# Patient Record
Sex: Male | Born: 1955 | Race: White | Hispanic: No | Marital: Married | Smoking: Former smoker
Health system: Southern US, Community
[De-identification: ages and names within clinical notes are randomized; demographics above are authoritative.]

## PROBLEM LIST (undated history)

## (undated) DIAGNOSIS — M519 Unspecified thoracic, thoracolumbar and lumbosacral intervertebral disc disorder: Secondary | ICD-10-CM

## (undated) DIAGNOSIS — E785 Hyperlipidemia, unspecified: Secondary | ICD-10-CM

## (undated) DIAGNOSIS — I2510 Atherosclerotic heart disease of native coronary artery without angina pectoris: Secondary | ICD-10-CM

## (undated) DIAGNOSIS — G4733 Obstructive sleep apnea (adult) (pediatric): Secondary | ICD-10-CM

## (undated) DIAGNOSIS — G8929 Other chronic pain: Secondary | ICD-10-CM

## (undated) DIAGNOSIS — M545 Low back pain, unspecified: Secondary | ICD-10-CM

## (undated) DIAGNOSIS — Z9861 Coronary angioplasty status: Secondary | ICD-10-CM

## (undated) DIAGNOSIS — I10 Essential (primary) hypertension: Secondary | ICD-10-CM

## (undated) DIAGNOSIS — F419 Anxiety disorder, unspecified: Secondary | ICD-10-CM

## (undated) DIAGNOSIS — I358 Other nonrheumatic aortic valve disorders: Secondary | ICD-10-CM

## (undated) DIAGNOSIS — I2111 ST elevation (STEMI) myocardial infarction involving right coronary artery: Secondary | ICD-10-CM

## (undated) HISTORY — DX: Other chronic pain: G89.29

## (undated) HISTORY — DX: Anxiety disorder, unspecified: F41.9

## (undated) HISTORY — DX: Hyperlipidemia, unspecified: E78.5

## (undated) HISTORY — DX: Obstructive sleep apnea (adult) (pediatric): G47.33

## (undated) HISTORY — DX: ST elevation (STEMI) myocardial infarction involving right coronary artery: I21.11

## (undated) HISTORY — DX: Atherosclerotic heart disease of native coronary artery without angina pectoris: I25.10

## (undated) HISTORY — DX: Coronary angioplasty status: Z98.61

## (undated) HISTORY — DX: Low back pain, unspecified: M54.50

## (undated) HISTORY — DX: Low back pain: M54.5

## (undated) HISTORY — DX: Other nonrheumatic aortic valve disorders: I35.8

## (undated) HISTORY — DX: Unspecified thoracic, thoracolumbar and lumbosacral intervertebral disc disorder: M51.9

---

## 1996-05-30 DIAGNOSIS — I2111 ST elevation (STEMI) myocardial infarction involving right coronary artery: Secondary | ICD-10-CM

## 1996-05-30 DIAGNOSIS — I2510 Atherosclerotic heart disease of native coronary artery without angina pectoris: Secondary | ICD-10-CM

## 1996-05-30 HISTORY — DX: ST elevation (STEMI) myocardial infarction involving right coronary artery: I21.11

## 1996-05-30 HISTORY — DX: Atherosclerotic heart disease of native coronary artery without angina pectoris: I25.10

## 1996-12-23 HISTORY — PX: LEFT HEART CATH AND CORONARY ANGIOGRAPHY: CATH118249

## 2003-07-19 DIAGNOSIS — F419 Anxiety disorder, unspecified: Secondary | ICD-10-CM

## 2003-07-19 DIAGNOSIS — G4733 Obstructive sleep apnea (adult) (pediatric): Secondary | ICD-10-CM

## 2003-07-19 HISTORY — DX: Anxiety disorder, unspecified: F41.9

## 2003-07-19 HISTORY — DX: Obstructive sleep apnea (adult) (pediatric): G47.33

## 2003-12-02 HISTORY — PX: HERNIA REPAIR: SHX51

## 2004-02-13 ENCOUNTER — Inpatient Hospital Stay (HOSPITAL_COMMUNITY): Admission: RE | Admit: 2004-02-13 | Discharge: 2004-02-15 | Payer: Self-pay | Admitting: Surgery

## 2005-01-11 IMAGING — RF DG UGI W/ GASTROGRAFIN
11 series · 11 of 11 positions shown · non-contrast
Comparison: none

CLINICAL DATA: Hiatal hernia.  Status post surgical repair and gastropexy.
SINGLE COLUMN UPPER GI

[Series 1: run · 1 of 1 slices shown (1 of 11)]
[im 1/1]
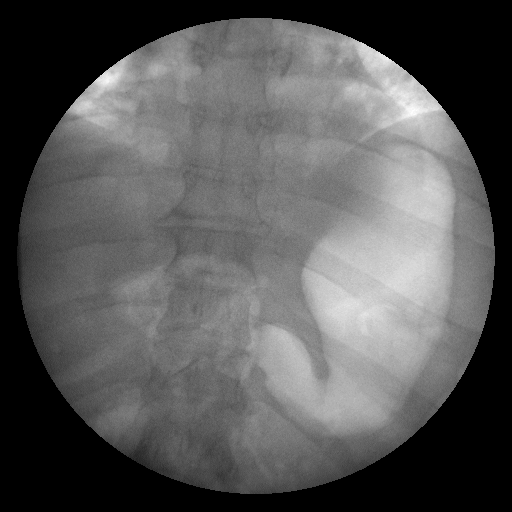

[Series 2: run · 1 of 1 slices shown (2 of 11)]
[im 1/1]
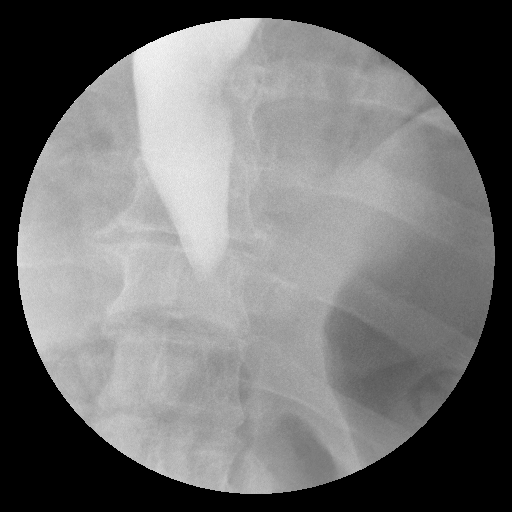

[Series 3: run · 1 of 1 slices shown (3 of 11)]
[im 1/1]
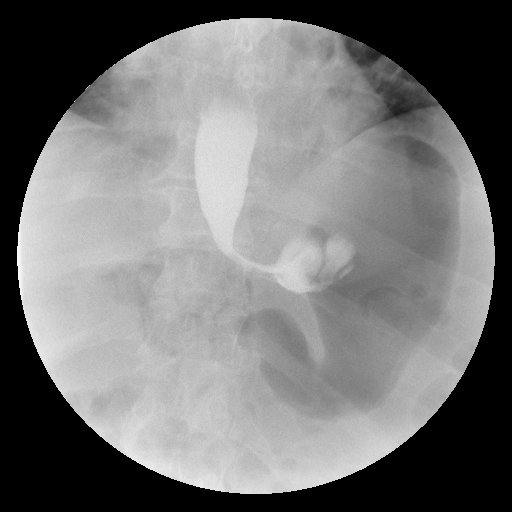

[Series 4: run · 1 of 1 slices shown (4 of 11)]
[im 1/1]
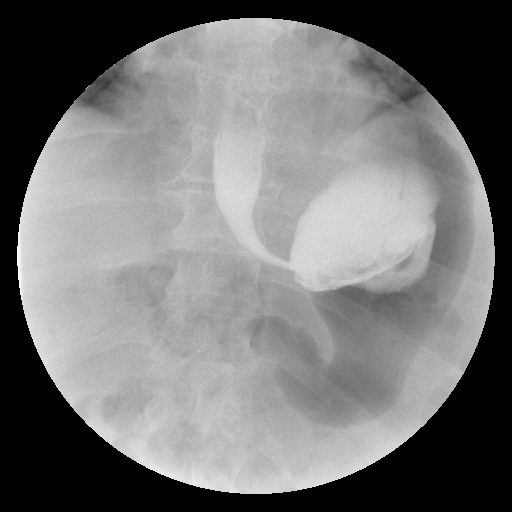

[Series 5: run · 1 of 1 slices shown (5 of 11)]
[im 1/1]
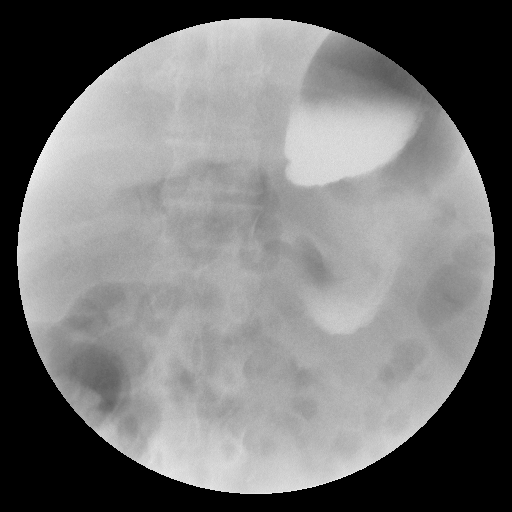

[Series 6: run · 1 of 1 slices shown (6 of 11)]
[im 1/1]
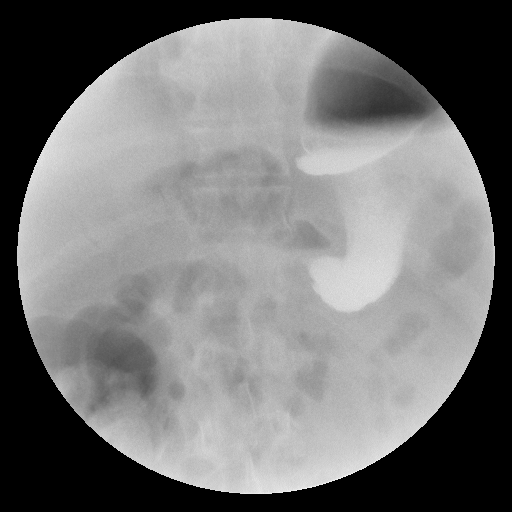

[Series 7: run · 1 of 1 slices shown (7 of 11)]
[im 1/1]
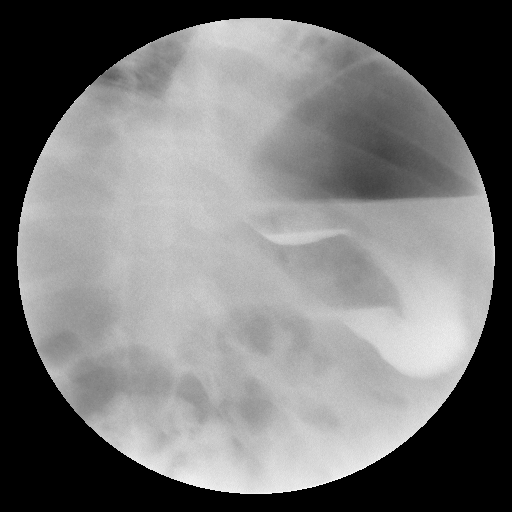

[Series 8: run · 1 of 1 slices shown (8 of 11)]
[im 1/1]
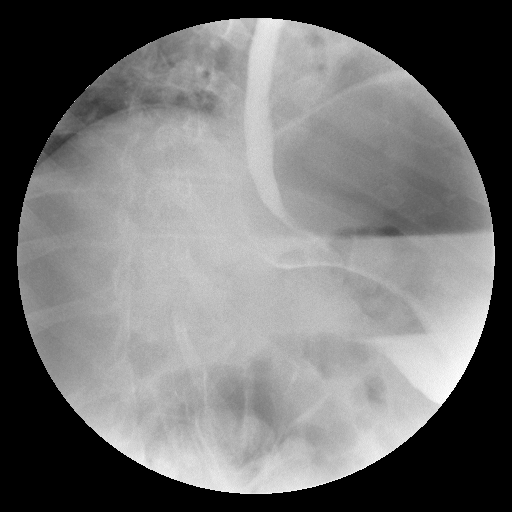

[Series 9: run · 1 of 1 slices shown (9 of 11)]
[im 1/1]
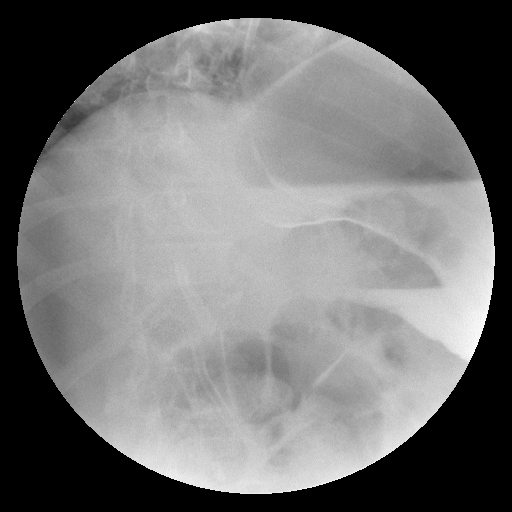

[Series 10: run · 1 of 1 slices shown (10 of 11)]
[im 1/1]
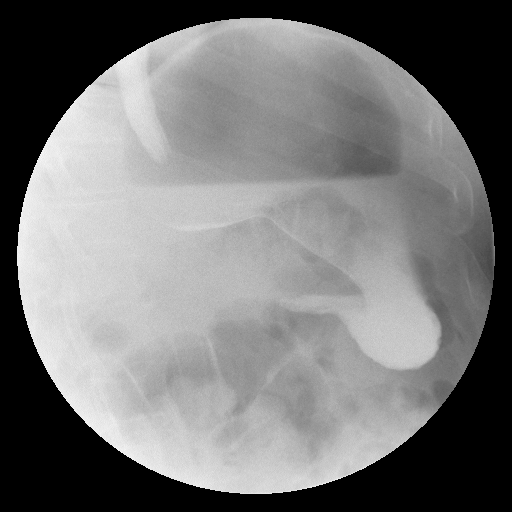

[Series 11: run · 1 of 1 slices shown (11 of 11)]
[im 1/1]
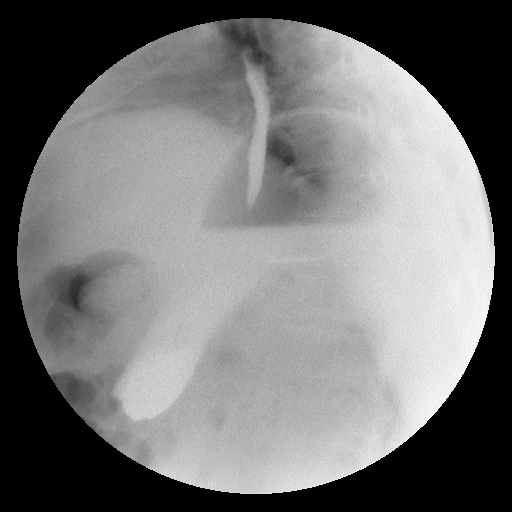

[11 of 11 positions shown; findings below may reference images not displayed]

FINDINGS: Scout abdominal radiograph demonstrates a normal bowel gas pattern.  There is gas and fluid partially distending the stomach. 
Single column Gastrografin study demonstrates normal flow through the esophagus.  There is some mild tapered narrowing at the GE junction, but the contrast flows normally across into the gastric fundus.  There is no evidence of extravasation.  The remainder of the stomach is unremarkable, with limitation of evaluation of fine detail due to the preexisting fluid  in the stomach. 
IMPRESSION
Negative for extravasation or obstruction at the level of the gastroesophageal junction.

## 2005-03-05 ENCOUNTER — Ambulatory Visit (HOSPITAL_COMMUNITY): Admission: RE | Admit: 2005-03-05 | Discharge: 2005-03-05 | Payer: Self-pay | Admitting: Family Medicine

## 2007-04-21 ENCOUNTER — Emergency Department (HOSPITAL_COMMUNITY): Admission: EM | Admit: 2007-04-21 | Discharge: 2007-04-21 | Payer: Self-pay | Admitting: Emergency Medicine

## 2008-05-01 HISTORY — PX: NM MYOVIEW LTD: HXRAD82

## 2009-04-13 ENCOUNTER — Encounter: Payer: Self-pay | Admitting: Internal Medicine

## 2009-05-07 ENCOUNTER — Ambulatory Visit: Payer: Self-pay | Admitting: Internal Medicine

## 2009-05-07 ENCOUNTER — Ambulatory Visit (HOSPITAL_COMMUNITY): Admission: RE | Admit: 2009-05-07 | Discharge: 2009-05-07 | Payer: Self-pay | Admitting: Internal Medicine

## 2010-01-01 ENCOUNTER — Encounter: Admission: RE | Admit: 2010-01-01 | Discharge: 2010-01-01 | Payer: Self-pay | Admitting: Cardiovascular Disease

## 2010-01-01 DIAGNOSIS — M519 Unspecified thoracic, thoracolumbar and lumbosacral intervertebral disc disorder: Secondary | ICD-10-CM

## 2010-01-01 HISTORY — DX: Unspecified thoracic, thoracolumbar and lumbosacral intervertebral disc disorder: M51.9

## 2011-04-15 NOTE — Op Note (Signed)
NAME:  Omar [name], Omar [name]           ACCOUNT NO.:  [id number]   MEDICAL RECORD NO.:  [id number]          PATIENT TYPE:  AMB   LOCATION:  DAY                           FACILITY:  [facility]   PHYSICIAN:  R. Roetta Sessions, M.D. DATE OF BIRTH:  1956-06-27   DATE OF PROCEDURE:  05/07/2009  DATE OF DISCHARGE:                               OPERATIVE REPORT   PROCEDURE:  Screening colonoscopy.   INDICATIONS FOR PROCEDURE:  A 55 year old gentleman sent over courtesy  of Dr. Joette Catching for colorectal cancer screening.  [name] is  devoid of any lower GI tract symptoms.  He has never had his lower GI  tract evaluated.  There is no family history of colon cancer or polyps.  Colonoscopy is now being done as a standard screening maneuver.  Risks,  benefits, alternatives and limitations have been reviewed and questions  answered.  Please see the typed dictation in the medical record.   PROCEDURE NOTE:  O2 saturation, blood pressure, pulse and respirations  were monitored throughout the entire procedure.  Conscious sedation:  Versed 3 mg IV, Demerol 75 mg IV in divided doses.   INSTRUMENT:  Pentax video chip system.   FINDINGS:  Digital rectal exam revealed no abnormalities.   ENDOSCOPIC FINDINGS:  The prep was good colon.  COLON:  Colonic mucosa  was surveyed from the rectosigmoid junction through the left transverse  right colon to the appendiceal orifice, ileocecal valve and cecum.  These structures were well seen and photographed for the record.  From  this level the scope was slowly and cautiously withdrawn.  All previous  mentioned mucosal surfaces were again seen.  The patient was noted to  have sigmoid diverticula and colonic mucosa  appeared normal.  Scope was  pulled down into the rectum.  RECTAL EXAMINATION:  The rectal mucosa,  including retroflexed view of the anal verge, demonstrated no  abnormalities.  The patient tolerated the procedure well and was  reactive in Endoscopy.   IMPRESSION:  1. Normal rectum.  2. Sigmoid diverticula and colonic mucosa appeared normal.   CECAL WITHDRAWAL TIME:  6 minutes.   RECOMMENDATIONS:  1. Diverticulosis literature provided to Mr. Wonder.  2. Recommend a repeat screening colonoscopy in 10 years.      Jonathon Bellows, M.D.  Electronically Signed     RMR/MEDQ  D:  05/07/2009  T:  05/07/2009  Job:  [id number]   cc:   Delaney Meigs, M.D.  Fax: [phone]

## 2011-04-18 NOTE — Discharge Summary (Signed)
Omar [name], Omar [name]                     ACCOUNT NO.:  [id number]   MEDICAL RECORD NO.:  [id number]                   PATIENT TYPE:  INP   LOCATION:  0466                                 FACILITY:  [facility]   PHYSICIAN:  Thornton Park. Daphine Deutscher, M.D.             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   DISCHARGE DIAGNOSIS:  Intermittent organoaxial volvulus of the stomach  status post laparoscopic dissection of the proximal foregut, closure of the  hiatus posteriorly, and anterior gastropexy.   COURSE IN THE HOSPITAL:  [name] is a 55 year old gentleman who had  been having pain in his upper abdomen and had a swallow at [facility] demonstrating an organoaxial volvulus with obstruction of his  proximal gastric inlet.  He underwent the above-mentioned procedure.  On the  first postop day, a Gastrografin swallow which was performed showed good  flow through the GE junction and that the stomach was straightened out and  was pexed properly.  He was begun on liquids which he tolerated and he  advanced and was ready for discharge on March 17th.  His incisions looked  good as did the sites in the left upper quadrant where we pexed the stomach  to the anterior abdominal wall.  He was given Mepergan Fortis to take at  home for pain and to follow up in the office in approximately 3 weeks.  Dietary instructions were issued to him and his wife.   CONDITION:  Good.                                               Thornton Park Daphine Deutscher, M.D.    MBM/MEDQ  D:  02/15/2004  T:  02/17/2004  Job:  [id number]

## 2011-04-18 NOTE — Op Note (Signed)
NAME:  Omar [name], Omar [name]                     ACCOUNT NO.:  [id number]   MEDICAL RECORD NO.:  [id number]                   PATIENT TYPE:  AMB   LOCATION:  DAY                                  FACILITY:  [facility]   PHYSICIAN:  Thornton Park. Daphine Deutscher, M.D.             DATE OF BIRTH:  [birth year]   DATE OF PROCEDURE:  02/12/2004  DATE OF DISCHARGE:                                 OPERATIVE REPORT   PREOPERATIVE INDICATIONS:  Omar [name] is a 55 year old white male whose  had intermittent bouts of bloating and upper abdominal pain. His worst bout  was over the  Thanksgiving holiday weekend. He had a workup by Delaney Meigs, M.D.  and this started off with an upper GI which suggested a  posterior organoaxial volvulus. I reviewed that and agreed that there was  some twisting although I thought it was possibly an anterior volvulus.  On a  couple of cuts, I looked like it might be above his EG junction like he may  have concomitant hiatal hernia.  A CT scan shows the eventration of the  diaphragm.  We had fairly extensive discussions about laparoscopic repair of  the hiatus and dissection of the hiatus possibly including Nissen  fundoplication or a gastroplasty or gastrostomy.   The patient was taken to room one, given general anesthesia. I entered the  abdomen slightly to the left of the midline through a transverse incision  using the Optiview without difficulty.  The abdomen was insufflated and the  5 mm was placed in the upper midline. Through this the articulating 5 mm  squiggly retractor was used to retract the liver. Two 10/11's were placed on  the right and one on the left.  The 30 degree scope was inserted from the  get go.  I went up and looked at the upper abdomen, retracted the liver and  saw the EG junction. I did not see an anterior or left sided hernia.  The  stomach was grasped and anteriorly it was noted to be somewhat floppy  extremely slow with almost like a valley along  the lesser curvature.  Because of the question on the x-ray of posterior volvulus, I wanted to look  polyps and make sure there was not some posterior hiatal hernia and went  ahead and incised the pars __________ and then dissected around the  esophagus visualizing the nerve and getting through to the other side using  blunt 10 mm finger dissector.  Initially I thought about passing a Penrose  drain but elected to not do that. As I looked posteriorly, I did not see the  big area where a posterior volvulus could occur and I elected to go ahead  and repair the hiatus in that location. This was done with a single suture  of Surgidek 2-0.  This was tied extracorporeally.  Next, I went ahead and  examined the stomach again going along the greater curvature  and again found  this kind of almost crevasse along from the cardia down along the  __________where the stomach with this redundancy could easily appear to flop  anteriorly.  After studying this and looking at the gallbladder which looked  fine, the liver looked fine, everything else looked good and reports were  that the ligament of Treitz was in the normal location and I had his upper  GI films with me.  I elected to then pex the greater curvature of the  stomach anteriorly to the anterior abdominal wall.  Because he was very high  riding, we did this just below the costal margin on the left side and I did  this by placing purchases along the greater curvature with the endostitch  and then tacking them up getting them with a suture grabber through a small  incision so that we tied these above the fascia anchoring the stomach with  two sutures of 2-0 silk to the anterior abdominal wall.  This seemed to hold  the anatomy and fix it to prevent it from twisting.  The EG junction was  examined and looked fine at this point. The patient seemed to tolerate the  procedure well.  I looked around again and then deflated the abdomen  withdrawing the  trocars.  All wounds including the two  little puncture wounds where the silks were tied down were all closed with 4-  0 Vicryl with Benzoin and Steri-Strips. The wounds were all injected with  0.5% Marcaine. The patient seemed to tolerate the procedure well and was  taken to the recovery room in satisfactory condition                                               [name], M.D.    MBM/MEDQ  D:  02/12/2004  T:  02/12/2004  Job:  [id number]   cc:   Delaney Meigs, M.D.  [address]  [place]  [place] [address]  Fax: [phone]   CCS

## 2012-12-01 DIAGNOSIS — I358 Other nonrheumatic aortic valve disorders: Secondary | ICD-10-CM | POA: Insufficient documentation

## 2012-12-01 HISTORY — DX: Other nonrheumatic aortic valve disorders: I35.8

## 2012-12-01 HISTORY — PX: TRANSTHORACIC ECHOCARDIOGRAM: SHX275

## 2012-12-03 ENCOUNTER — Other Ambulatory Visit (HOSPITAL_COMMUNITY): Payer: Self-pay | Admitting: Cardiovascular Disease

## 2012-12-03 DIAGNOSIS — R011 Cardiac murmur, unspecified: Secondary | ICD-10-CM

## 2012-12-09 ENCOUNTER — Ambulatory Visit (HOSPITAL_COMMUNITY): Payer: Self-pay

## 2012-12-10 ENCOUNTER — Other Ambulatory Visit (HOSPITAL_COMMUNITY): Payer: Self-pay | Admitting: Cardiovascular Disease

## 2012-12-10 ENCOUNTER — Ambulatory Visit (HOSPITAL_COMMUNITY)
Admission: RE | Admit: 2012-12-10 | Discharge: 2012-12-10 | Disposition: A | Discharge status: Home / Self Care | Payer: Self-pay | Source: Ambulatory Visit | Attending: Cardiovascular Disease | Admitting: Cardiovascular Disease

## 2012-12-10 DIAGNOSIS — R011 Cardiac murmur, unspecified: Secondary | ICD-10-CM

## 2012-12-10 DIAGNOSIS — I369 Nonrheumatic tricuspid valve disorder, unspecified: Secondary | ICD-10-CM | POA: Insufficient documentation

## 2012-12-10 DIAGNOSIS — I2510 Atherosclerotic heart disease of native coronary artery without angina pectoris: Secondary | ICD-10-CM | POA: Insufficient documentation

## 2012-12-10 NOTE — Progress Notes (Signed)
[facility]   2D echo completed 12/10/2012.   Cindy Namya Voges, RDCS   

## 2013-01-25 ENCOUNTER — Encounter: Payer: Self-pay | Admitting: *Deleted

## 2013-01-26 ENCOUNTER — Encounter: Payer: Self-pay | Admitting: *Deleted

## 2013-12-23 ENCOUNTER — Other Ambulatory Visit: Payer: Self-pay | Admitting: *Deleted

## 2013-12-23 MED ORDER — SIMVASTATIN 40 MG PO TABS: 40.0000 mg | ORAL_TABLET | Freq: Every evening | ORAL | Status: DC

## 2014-06-06 ENCOUNTER — Other Ambulatory Visit: Payer: Self-pay

## 2014-06-06 MED ORDER — METOPROLOL SUCCINATE ER 25 MG PO TB24: 12.5000 mg | ORAL_TABLET | Freq: Every day | ORAL | Status: DC

## 2014-06-06 NOTE — Telephone Encounter (Signed)
Rx was sent to pharmacy electronically. Patient last office visit - 12/13/2012 Dr Marella Chimes.

## 2014-08-14 ENCOUNTER — Other Ambulatory Visit: Payer: Self-pay | Admitting: Internal Medicine

## 2014-08-22 ENCOUNTER — Telehealth: Payer: Self-pay | Admitting: Cardiology

## 2014-08-22 MED ORDER — METOPROLOL SUCCINATE ER 25 MG PO TB24: 12.5000 mg | ORAL_TABLET | Freq: Every day | ORAL | Status: DC

## 2014-08-22 NOTE — Telephone Encounter (Signed)
Pt called in stating that he is out of his metoprolol and would like it called in to the [facility] in [place]. [phone]. Please call  Thanks

## 2014-08-22 NOTE — Telephone Encounter (Signed)
Rx was sent to pharmacy electronically. OV 08/31/14

## 2014-08-31 ENCOUNTER — Ambulatory Visit (INDEPENDENT_AMBULATORY_CARE_PROVIDER_SITE_OTHER): Payer: Self-pay | Admitting: Cardiology

## 2014-08-31 ENCOUNTER — Encounter: Payer: Self-pay | Admitting: Cardiology

## 2014-08-31 VITALS — BP 122/80 | HR 64 | Ht 68.0 in | Wt 199.7 lb

## 2014-08-31 DIAGNOSIS — Z79899 Other long term (current) drug therapy: Secondary | ICD-10-CM

## 2014-08-31 DIAGNOSIS — I358 Other nonrheumatic aortic valve disorders: Secondary | ICD-10-CM

## 2014-08-31 DIAGNOSIS — I2510 Atherosclerotic heart disease of native coronary artery without angina pectoris: Secondary | ICD-10-CM

## 2014-08-31 DIAGNOSIS — E785 Hyperlipidemia, unspecified: Secondary | ICD-10-CM

## 2014-08-31 DIAGNOSIS — Z9861 Coronary angioplasty status: Secondary | ICD-10-CM

## 2014-08-31 DIAGNOSIS — E669 Obesity, unspecified: Secondary | ICD-10-CM | POA: Insufficient documentation

## 2014-08-31 DIAGNOSIS — G4733 Obstructive sleep apnea (adult) (pediatric): Secondary | ICD-10-CM

## 2014-08-31 DIAGNOSIS — I2111 ST elevation (STEMI) myocardial infarction involving right coronary artery: Secondary | ICD-10-CM

## 2014-08-31 LAB — LIPID PANEL
CHOL/HDL RATIO: 3.8 ratio
Cholesterol: 154 mg/dL (ref 0–200)
HDL: 41 mg/dL (ref >39)
LDL CALC: 79 mg/dL (ref 0–99)
Triglycerides: 171 mg/dL — ABNORMAL HIGH (ref <150)
VLDL: 34 mg/dL (ref 0–40)

## 2014-08-31 LAB — COMPREHENSIVE METABOLIC PANEL
ALK PHOS: 83 U/L (ref 39–117)
ALT: 17 U/L (ref 0–53)
AST: 22 U/L (ref 0–37)
Albumin: 4.7 g/dL (ref 3.5–5.2)
BUN: 12 mg/dL (ref 6–23)
CALCIUM: 10 mg/dL (ref 8.4–10.5)
CHLORIDE: 103 meq/L (ref 96–112)
CO2: 26 meq/L (ref 19–32)
Creat: 0.97 mg/dL (ref 0.50–1.35)
Glucose, Bld: 87 mg/dL (ref 70–99)
POTASSIUM: 4.9 meq/L (ref 3.5–5.3)
SODIUM: 140 meq/L (ref 135–145)
Total Bilirubin: 1.3 mg/dL — ABNORMAL HIGH (ref 0.2–1.2)
Total Protein: 7.2 g/dL (ref 6.0–8.3)

## 2014-08-31 MED ORDER — METOPROLOL SUCCINATE ER 25 MG PO TB24: 12.5000 mg | ORAL_TABLET | Freq: Every day | ORAL | Status: DC

## 2014-08-31 MED ORDER — SIMVASTATIN 40 MG PO TABS: 40.0000 mg | ORAL_TABLET | Freq: Every day | ORAL | Status: DC

## 2014-08-31 NOTE — Progress Notes (Signed)
PATIENT: Omar [name] MRN: [id number] DOB: [birth year] PCP: Sherrie Mustache, MD  Clinic Note: Chief Complaint  Patient presents with  . 21 MONTHS VISIT    FORMER APTIENT OF WEINTRAUB,NO CHEST PAIN , NO SOB , NO EDEMA   HPI: Omar [name] is a 58 y.o. male with a PMH below who presents today for re-establishing Cardiology care after the retirement of Dr. Terance Ice.  His CAD history began on May 30, 1996 when he presented with an Inferior STEMI -- PTCA of RCA as part of the PAMI trial (randomized to PTCA & not Stent).   As part of the trial he had a re-look cath the following year that showed minimal residual stenosis @ the PTCA site & has had several negative stress tests since (last one in 2009). He was last seen by Dr. Rollene Fare in Jan 2014.  .  Interval History: Since they really without any complaints cardiac standpoint. He is mostly limited by back pain, but has continued to do relatively well with his plans to lose weight. He actually was down about 190 pounds this spring but gained about 9 pounds back because his not to exercise as much as he had been due to his back pain. He denies any resting or exertional chest pain or dyspnea. No PND, orthopnea or edema. No palpitations, lightheadedness, dizziness, weakness or syncope/near syncope. No TIA/amaurosis fugax symptoms. No claudication.   Past Medical History  Diagnosis Date  . ST elevation myocardial infarction (STEMI) involving right coronary artery in recovery phase 05/30/1996    100% occluded RCA -- PTCA-POBA (~20% residual) of RCA (part of PAMI Trial) ;; Normal EF By Echo in 2014  . CAD S/P percutaneous coronary angioplasty 05/30/1996    single -vessel disease (RCA) Rx with POBA on PAMI protocol in june 1997; Re-Look Cath 12/1996: ~20-30% residual no stenosis in 1998 with good LV Function; Myoview 6/'09:  low risk scan ,post EF 58%, this was the fourth such scan in as many years  . Dyslipidemia, goal LDL below  70     On statin  . Obstructive sleep apnea 07-19-2003    breathing disorder services,Dr. Steward Drone. 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  . Anxiety 07-19-2003    Dr.Saaat Humphrey Rolls  . Lumbosacral disc disease Feb,1 2011    ThIs information from CT done several years ago  . Aortic valve sclerosis January 2014    Noted on echocardiogram to have aortic sclerosis without stenosis. Ordered for murmur  . Chronic low back pain     Has seen Dr. Marlou Sa    Prior Cardiac Evaluation and Past Surgical History: Past Surgical History  Procedure Laterality Date  . Cardiac catheterization  12-23-1996    ASHD h/o acute DMI with emergency PTCA- randomized to balloon PTCA 05-30-1996 reperfusion time 2 hrs 2 min.,single vessel coronary disease RCA  . Hernia repair  2005    remote laparscopic hernia repair by Dr.martin in may 2005  . Nm myoview ltd  05/2008    Mild anterior defect consistent with chest wall attenuation. Normal EF and no ischemia, no infarction  . Transthoracic echocardiogram  January 2014    Mild concentric LVH. Normal systolic and diastolic function with EF 55-65%. Mild LA dilation. Aortic sclerosis without stenosis.    No Known Allergies  Current Outpatient Prescriptions  Medication Sig Dispense Refill  . aspirin 81 MG tablet Take 81 mg by  mouth daily.      . metoprolol succinate (TOPROL XL) 25 MG 24 hr tablet Take 0.5 tablets (12.5 mg total) by mouth daily.  15 tablet  11  . simvastatin (ZOCOR) 40 MG tablet Take 1 tablet (40 mg total) by mouth daily at 6 PM.  30 tablet  11  . vitamin E 400 UNIT capsule Take 400 Units by mouth daily.       No current facility-administered medications for this visit.    History   Social History Narrative   Married father of 2 with 3 children. He quit smoking in 1997.   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 He does drink not alcohol.   ROS: A comprehensive Review of  Systems - was performed Review of Systems  Constitutional: Positive for weight loss. Negative for malaise/fatigue.       Has been "eating better" & trying to walk more; Feels better with less weight, but actually gained back ~9lb from the spring.  HENT: Negative for congestion and nosebleeds.   Eyes: Negative for double vision.  Cardiovascular: Negative for chest pain, palpitations, orthopnea, claudication, leg swelling and PND.       Per HPI  Gastrointestinal: Positive for heartburn. Negative for blood in stool and melena.  Genitourinary: Negative for hematuria and flank pain.  Musculoskeletal: Positive for back pain and myalgias.       Chronic pain - limits ability to exercise; Occasional leg and calf cramps.  Neurological: Negative for dizziness, sensory change, speech change, focal weakness, seizures, loss of consciousness and headaches.  Endo/Heme/Allergies: Negative.  Does not bruise/bleed easily.  All other systems reviewed and are negative.  PHYSICAL EXAM BP 122/80  Pulse 64  Ht 5\' 8"  (1.727 m)  Wt 199 lb 11.2 oz (90.583 kg)  BMI 30.37 kg/m2 General appearance: alert, cooperative, appears stated age, no distress and Borderline obese. But otherwise relatively the appearing. Answers questions appropriately. Neck: no adenopathy, no carotid bruit, no JVD and supple, symmetrical, trachea midline Lungs: clear to auscultation bilaterally, normal percussion bilaterally and Nonlabored, good air movement Heart: normal apical impulse, S1, S2 normal, S4 present and 1-2/6 SEM at RUSB --> carotids, no rubs. Abdomen: soft, non-tender; bowel sounds normal; no masses,  no organomegaly Extremities: extremities normal, atraumatic, no cyanosis or edema Pulses: 2+ and symmetric Neurologic: Grossly normal   Adult ECG Report  Rate: 64 ;  Rhythm: normal sinus rhythm, Normal EKG (normal axis, intervals and voltage).  Recent Labs: Not available. Last documented labs from Dr. Rollene Fare were from June  20 13th: TC130, HDL 41, LDL 64. (On simvastatin)  ASSESSMENT / PLAN: Relatively stable from a standpoint notable symptoms since 1997.  ST elevation myocardial infarction (STEMI) involving right coronary artery in recovery phase On his last nuclear stress test, there was no suggestion of significant infarction zone although there was a questionable diaphragmatic attenuation that could have been infarct. There is no evidence of ischemia. He had minimal residual stenosis on renal cath 6 months later which bodes well for long-term patency.  CAD S/P percutaneous coronary angioplasty No further events since his MI in 43. He is on a statin and beta blocker along with aspirin. With normal EF and relatively well-controlled blood pressure, there is no clear indication for needing to use an ACE inhibitor or ARB.  Hyperlipidemia with target LDL less than 70 He has not had labs checked in over 2 years. He is currently on simvastatin, but is noting to have some cramping. Plan: He has  not eaten today, we'll check lipid panel and LFTs. Depending what his labs look like, would consider a one-month statin holiday off of simvastatin. If his cramps do not improve he was to restart simvastatin. However if the cramps do improve, I would probably try starting with pravastatin 40 mg daily.  Obstructive sleep apnea On CPAP. Doing relatively well  Obesity (BMI 30-39.9) Continue with the dietary adjustments. Hopefully as his back starts to feel better he will get back to exercising.  Aortic valve sclerosis Dr. Rollene Fare and noted a murmur during his last visit and check the echocardiogram which confirmed aortic sclerosis not stenosis. It is relatively soft murmur and only if he gets louder or more prominent would we reconsider a recheck echocardiogram just to evaluate the murmur.    Orders Placed This Encounter  Procedures  . Lipid panel    Order Specific Question:  Has the patient fasted?    Answer:  Yes  .  Comprehensive metabolic panel    Order Specific Question:  Has the patient fasted?    Answer:  Yes  . EKG 12-Lead   Meds ordered this encounter  Medications  . simvastatin (ZOCOR) 40 MG tablet    Sig: Take 1 tablet (40 mg total) by mouth daily at 6 PM.    Dispense:  30 tablet    Refill:  11  . metoprolol succinate (TOPROL XL) 25 MG 24 hr tablet    Sig: Take 0.5 tablets (12.5 mg total) by mouth daily.    Dispense:  15 tablet    Refill:  11   This was an initial visit after 18 months of not being seen him to establish a new cardiologist. I personally reviewed his catheterization, echocardiogram,as well as Myoview reports. I also had to review the previous notes by Dr. Rollene Fare.  Followup: 6 months  Zoeya Gramajo W. Ellyn Hack, M.D., M.S. Interventional Cardiolgy [facility]

## 2014-08-31 NOTE — Assessment & Plan Note (Signed)
He has not had labs checked in over 2 years. He is currently on simvastatin, but is noting to have some cramping. Plan: He has not eaten today, we'll check lipid panel and LFTs. Depending what his labs look like, would consider a one-month statin holiday off of simvastatin. If his cramps do not improve he was to restart simvastatin. However if the cramps do improve, I would probably try starting with pravastatin 40 mg daily.

## 2014-08-31 NOTE — Assessment & Plan Note (Signed)
On his last nuclear stress test, there was no suggestion of significant infarction zone although there was a questionable diaphragmatic attenuation that could have been infarct. There is no evidence of ischemia. He had minimal residual stenosis on renal cath 6 months later which bodes well for long-term patency.

## 2014-08-31 NOTE — Assessment & Plan Note (Signed)
On CPAP. Doing relatively well

## 2014-08-31 NOTE — Assessment & Plan Note (Signed)
Dr. Rollene Fare and noted a murmur during his last visit and check the echocardiogram which confirmed aortic sclerosis not stenosis. It is relatively soft murmur and only if he gets louder or more prominent would we reconsider a recheck echocardiogram just to evaluate the murmur.

## 2014-08-31 NOTE — Patient Instructions (Signed)
LABS - CMP ,LIPID   Your physician wants you to follow-up in [place].  You will receive a reminder letter in the mail two months in advance. If you don't receive a letter, please call our office to schedule the follow-up appointment.

## 2014-08-31 NOTE — Assessment & Plan Note (Signed)
No further events since his MI in 34. He is on a statin and beta blocker along with aspirin. With normal EF and relatively well-controlled blood pressure, there is no clear indication for needing to use an ACE inhibitor or ARB.

## 2014-08-31 NOTE — Assessment & Plan Note (Signed)
Continue with the dietary adjustments. Hopefully as his back starts to feel better he will get back to exercising.

## 2014-09-01 ENCOUNTER — Encounter: Payer: Self-pay | Admitting: *Deleted

## 2014-09-01 ENCOUNTER — Telehealth: Payer: Self-pay | Admitting: *Deleted

## 2014-09-01 NOTE — Telephone Encounter (Signed)
Message copied by Raiford Simmonds on Fri Sep 01, 2014 11:51 AM ------      Message from: Ellyn Hack, DAVID W      Created: Thu Aug 31, 2014  9:55 PM       Labs look OK - TG a bit high, relates to too much starchy foods.  TC, HDL & LDL look good with almost Goal LDL.            We had discussed taking a Statin HOLIDAY for 1 month to see if this helps his cramps.  If cramps do not improve, restart statin.  If they do - will changes to Pravastatin 40 mg (less risk of cramps)            Leonie Man, MD       ------

## 2014-09-01 NOTE — Telephone Encounter (Signed)
Spoke to wife. lab Result given . Verbalized understanding A letter mailed  With results

## 2015-02-12 DIAGNOSIS — I219 Acute myocardial infarction, unspecified: Secondary | ICD-10-CM | POA: Insufficient documentation

## 2015-03-26 ENCOUNTER — Encounter: Payer: Self-pay | Admitting: Cardiology

## 2015-03-26 ENCOUNTER — Ambulatory Visit (INDEPENDENT_AMBULATORY_CARE_PROVIDER_SITE_OTHER): Payer: 59 | Admitting: Cardiology

## 2015-03-26 VITALS — BP 126/84 | HR 56 | Ht 68.0 in | Wt 197.9 lb

## 2015-03-26 DIAGNOSIS — G4733 Obstructive sleep apnea (adult) (pediatric): Secondary | ICD-10-CM

## 2015-03-26 DIAGNOSIS — Z79899 Other long term (current) drug therapy: Secondary | ICD-10-CM

## 2015-03-26 DIAGNOSIS — E785 Hyperlipidemia, unspecified: Secondary | ICD-10-CM

## 2015-03-26 DIAGNOSIS — I2510 Atherosclerotic heart disease of native coronary artery without angina pectoris: Secondary | ICD-10-CM | POA: Diagnosis not present

## 2015-03-26 DIAGNOSIS — I2111 ST elevation (STEMI) myocardial infarction involving right coronary artery: Secondary | ICD-10-CM

## 2015-03-26 DIAGNOSIS — I358 Other nonrheumatic aortic valve disorders: Secondary | ICD-10-CM | POA: Diagnosis not present

## 2015-03-26 DIAGNOSIS — E669 Obesity, unspecified: Secondary | ICD-10-CM

## 2015-03-26 DIAGNOSIS — Z9861 Coronary angioplasty status: Secondary | ICD-10-CM

## 2015-03-26 LAB — COMPREHENSIVE METABOLIC PANEL
ALT: 19 U/L (ref 0–53)
AST: 23 U/L (ref 0–37)
Albumin: 4.9 g/dL (ref 3.5–5.2)
Alkaline Phosphatase: 77 U/L (ref 39–117)
BUN: 16 mg/dL (ref 6–23)
CO2: 26 meq/L (ref 19–32)
CREATININE: 0.9 mg/dL (ref 0.50–1.35)
Calcium: 9.9 mg/dL (ref 8.4–10.5)
Chloride: 104 mEq/L (ref 96–112)
Glucose, Bld: 88 mg/dL (ref 70–99)
POTASSIUM: 4.7 meq/L (ref 3.5–5.3)
Sodium: 139 mEq/L (ref 135–145)
Total Bilirubin: 1.3 mg/dL — ABNORMAL HIGH (ref 0.2–1.2)
Total Protein: 7.3 g/dL (ref 6.0–8.3)

## 2015-03-26 LAB — LIPID PANEL
CHOL/HDL RATIO: 3.6 ratio
CHOLESTEROL: 169 mg/dL (ref 0–200)
HDL: 47 mg/dL (ref >=40)
LDL Cholesterol: 103 mg/dL — ABNORMAL HIGH (ref 0–99)
Triglycerides: 93 mg/dL (ref <150)
VLDL: 19 mg/dL (ref 0–40)

## 2015-03-26 NOTE — Assessment & Plan Note (Signed)
On simvastatin.labs checked back in October showed elevated triglycerides but with LDL almost at goal at 79.  HDL is at goal.  Plan for now is to just simply continue simvastatin. Recheck labs now, and then prior to one year followup. Make adjustments according to follow up labs.

## 2015-03-26 NOTE — Assessment & Plan Note (Signed)
The patient understands the need to lose weight with diet and exercise. We have discussed specific strategies for this.  

## 2015-03-26 NOTE — Assessment & Plan Note (Signed)
No suggestion of a large infarct on Myoview. Questionable diaphragmatic attenuation which may have well been the infarct. No evidence of ischemia. Minimal restenosis on relook cath. No recurrent anginal symptoms or heart failure symptoms. No arrhythmias.

## 2015-03-26 NOTE — Patient Instructions (Signed)
LABS CMP ,LIPID  LABS IN 12 MONTHS , WILL MAIL LAB SLIP AT THAT TIME.  Your physician wants you to follow-up in [place].  You will receive a reminder letter in the mail two months in advance. If you don't receive a letter, please call our office to schedule the follow-up appointment.

## 2015-03-26 NOTE — Progress Notes (Signed)
PATIENT: Omar [name] MRN: [id number] DOB: [birth year] PCP: Sherrie Mustache, MD  Clinic Note: Chief Complaint  Patient presents with  . 6 MONTH VISIT    NO CHEST PAIN, NO SOB ,NO SWELLING  . Coronary Artery Disease   HPI: Omar [name] is a 59 y.o. male with a PMH below who presents today for re-establishing Cardiology care after the retirement of Dr. Terance Ice.  His CAD history began on May 30, 1996 when he presented with an Inferior STEMI -- PTCA of RCA as part of the PAMI trial (randomized to PTCA & not Stent).   As part of the trial he had a re-look cath the following year that showed minimal residual stenosis @ the PTCA site & has had several negative stress tests since (last one in 2009). He was last seen by Dr. Rollene Fare in Jan 2014.   Stopped his work with the [employer] ~ 6 yrs ago.  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  Interval History:  He continues to remain active & denies any cardiovascular symptoms.   Plays golf 5-6 days / week.  Does walking & farm work (bush-hogging).   Cardiovascular ROS: no chest pain or dyspnea on exertion negative for - chest pain, dyspnea on exertion, edema, irregular heartbeat, loss of consciousness, orthopnea, palpitations, paroxysmal nocturnal dyspnea, rapid heart rate, shortness of breath or TIA/Amaurosis fugax, syncope/ near syncope; no claudication.  Past Medical History  Diagnosis Date  . ST elevation myocardial infarction (STEMI) involving right coronary artery in recovery phase 05/30/1996    100% occluded RCA -- PTCA-POBA (~20% residual) of RCA (part of PAMI Trial) ;; Normal EF By Echo in 2014  . CAD S/P percutaneous coronary angioplasty 05/30/1996    single -vessel disease (RCA) Rx with POBA on PAMI protocol in june 1997; Re-Look Cath 12/1996: ~20-30% residual no stenosis in 1998 with good LV Function; Myoview 6/'09:  low risk scan ,post EF 58%, this was the fourth such scan in as many years  .  Dyslipidemia, goal LDL below 70     On statin  . Obstructive sleep apnea 07-19-2003    breathing disorder services,Dr. Steward Drone. 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  . Anxiety 07-19-2003    Dr.Saaat Humphrey Rolls  . Lumbosacral disc disease Feb,1 2011    ThIs information from CT done several years ago  . Aortic valve sclerosis January 2014    Noted on echocardiogram to have aortic sclerosis without stenosis. Ordered for murmur  . Chronic low back pain     Has seen Dr. Marlou Sa    Prior Cardiac Evaluation and Past Surgical History: Past Surgical History  Procedure Laterality Date  . Cardiac catheterization  12-23-1996    ASHD h/o acute DMI with emergency PTCA- randomized to balloon PTCA 05-30-1996 reperfusion time 2 hrs 2 min.,single vessel coronary disease RCA  . Hernia repair  2005    remote laparscopic hernia repair by Dr.martin in may 2005  . Nm myoview ltd  05/2008    Mild anterior defect consistent with chest wall attenuation. Normal EF and no ischemia, no infarction  . Transthoracic echocardiogram  January 2014    Mild concentric LVH. Normal systolic and diastolic function with EF 55-65%. Mild LA dilation. Aortic sclerosis without stenosis.    No Known Allergies  Current Outpatient Prescriptions  Medication Sig Dispense Refill  . aspirin 81 MG tablet Take 81 mg by  mouth daily.    . metoprolol succinate (TOPROL XL) 25 MG 24 hr tablet Take 0.5 tablets (12.5 mg total) by mouth daily. 15 tablet 11  . simvastatin (ZOCOR) 40 MG tablet Take 1 tablet (40 mg total) by mouth daily at 6 PM. 30 tablet 11  . vitamin E 400 UNIT capsule Take 400 Units by mouth daily.     No current facility-administered medications for this visit.    History   Social History Narrative   Married father of 2 with 3 children. He quit smoking in 1997.   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 He does drink not alcohol.  History  reviewed. No pertinent family history.   ROS: A comprehensive Review of Systems - was performed Review of Systems  Constitutional: Negative for malaise/fatigue.  HENT: Positive for congestion (runny nose). Negative for nosebleeds.   Eyes:       Eyes burning  Respiratory: Positive for cough (from allergies) and wheezing. Negative for shortness of breath.   Cardiovascular: Negative for claudication.  Gastrointestinal: Positive for constipation (went to see GI physicial - changed him to benefiber with some help.). Negative for heartburn, blood in stool and melena.  Genitourinary: Negative for hematuria.  Neurological: Positive for tingling (bilateral feet - also with a sense of burning).  Endo/Heme/Allergies: Does not bruise/bleed easily.  All other systems reviewed and are negative.    PHYSICAL EXAM BP 126/84 mmHg  Pulse 56  Ht 5\' 8"  (1.727 m)  Wt 197 lb 14.4 oz (89.767 kg)  BMI 30.10 kg/m2 General appearance: alert, cooperative, appears stated age, no distress and Borderline obese. But otherwise relatively the appearing. Answers questions appropriately. Neck: no adenopathy, no carotid bruit, no JVD and supple, symmetrical, trachea midline Lungs: clear to auscultation bilaterally, normal percussion bilaterally and Nonlabored, good air movement Heart: normal apical impulse, S1, S2 normal, S4 present and 1-2/6 SEM at RUSB --> carotids, no rubs. Abdomen: soft, non-tender; bowel sounds normal; no masses,  no organomegaly Extremities: extremities normal, atraumatic, no cyanosis or edema Pulses: 2+ and symmetric Neurologic: Grossly normal   Adult ECG Report  Rate: 56;  Rhythm: sinus bradycardia,  Otherwise normal EKG (normal axis, intervals and voltage).  Recent Labs: Not available.  Lab Results  Component Value Date   CHOL 154 08/31/2014   HDL 41 08/31/2014   LDLCALC 79 08/31/2014   TRIG 171* 08/31/2014   CHOLHDL 3.8 08/31/2014    ASSESSMENT / PLAN: Relatively stable from a  standpoint notable symptoms since 1997.  Problem List Items Addressed This Visit    Aortic valve sclerosis (Chronic)    He does have a systolic ejection murmur that is the early peaking and soft.  Crescendo-decrescendo. May check an echo in the next couple years if the murmur increases in intensity.      Relevant Orders   EKG 12-Lead   Comprehensive metabolic panel   Lipid panel   Comprehensive metabolic panel   Lipid panel   CAD S/P percutaneous coronary angioplasty - Primary (Chronic)    Status post PTCA only in 1997 of the RCA in the setting of an MI. Real calf one year later with minimal restenosis. Normal LV function. Nonischemic Myoview in 2009. Continues to be asymptomatic. Plan: Continue aspirin and watch for and statin.      Relevant Orders   EKG 12-Lead   Comprehensive metabolic panel   Lipid panel   Comprehensive metabolic panel   Lipid panel   Hyperlipidemia with target LDL less than 70 (  Chronic)    On simvastatin.labs checked back in October showed elevated triglycerides but with LDL almost at goal at 79.  HDL is at goal.  Plan for now is to just simply continue simvastatin. Recheck labs now, and then prior to one year followup. Make adjustments according to follow up labs.      Relevant Orders   EKG 12-Lead   Comprehensive metabolic panel   Lipid panel   Comprehensive metabolic panel   Lipid panel   Obesity (BMI 30-39.9) (Chronic)    The patient understands the need to lose weight with diet and exercise. We have discussed specific strategies for this.       Obstructive sleep apnea    On CPAP. Doing well.      Relevant Orders   EKG 12-Lead   Comprehensive metabolic panel   Lipid panel   Comprehensive metabolic panel   Lipid panel   ST elevation myocardial infarction (STEMI) involving right coronary artery in recovery phase (Chronic)    No suggestion of a large infarct on Myoview. Questionable diaphragmatic attenuation which may have well been the  infarct. No evidence of ischemia. Minimal restenosis on relook cath. No recurrent anginal symptoms or heart failure symptoms. No arrhythmias.       Other Visit Diagnoses    Polypharmacy        Relevant Orders    Comprehensive metabolic panel    Lipid panel    Comprehensive metabolic panel    Lipid panel       No orders of the defined types were placed in this encounter.    Followup: 1 yr   Obrian Bulson W. Ellyn Hack, M.D., M.S. Interventional Cardiolgy [facility]

## 2015-03-26 NOTE — Assessment & Plan Note (Signed)
On CPAP. Doing well.

## 2015-03-26 NOTE — Assessment & Plan Note (Signed)
He does have a systolic ejection murmur that is the early peaking and soft.  Crescendo-decrescendo. May check an echo in the next couple years if the murmur increases in intensity.

## 2015-03-26 NOTE — Assessment & Plan Note (Signed)
Status post PTCA only in 1997 of the RCA in the setting of an MI. Real calf one year later with minimal restenosis. Normal LV function. Nonischemic Myoview in 2009. Continues to be asymptomatic. Plan: Continue aspirin and watch for and statin.

## 2015-04-09 ENCOUNTER — Telehealth: Payer: Self-pay | Admitting: *Deleted

## 2015-04-09 NOTE — Telephone Encounter (Signed)
-----   Message from Omar Man, MD sent at 04/04/2015 11:51 PM EDT ----- Cholesterol level seems to taken a turn for a direction - LDL has increased from 79-103.  For the next 3 months need to be very cognizant of dietary intake lower fatty and fried foods, less processed sugars and starches. -- more fruits and vegetables Also increase exercise  Recheck labs in 3-4 months. If we still are not turning the right direction, we may need to either add Zetia or change to atorvastatin   Omar [name], Omar Green, MD

## 2015-04-09 NOTE — Telephone Encounter (Signed)
LEFT MESSAGE ON HOME PHONE TO CALL BACK

## 2015-04-12 ENCOUNTER — Other Ambulatory Visit: Payer: Self-pay | Admitting: *Deleted

## 2015-04-12 ENCOUNTER — Encounter: Payer: Self-pay | Admitting: *Deleted

## 2015-04-12 DIAGNOSIS — E785 Hyperlipidemia, unspecified: Secondary | ICD-10-CM

## 2015-04-12 DIAGNOSIS — Z9861 Coronary angioplasty status: Secondary | ICD-10-CM

## 2015-04-12 DIAGNOSIS — I2510 Atherosclerotic heart disease of native coronary artery without angina pectoris: Secondary | ICD-10-CM

## 2015-04-12 DIAGNOSIS — E669 Obesity, unspecified: Secondary | ICD-10-CM

## 2015-04-12 NOTE — Telephone Encounter (Signed)
Mailed results and instruction, will lab slip  in 3-4 months.

## 2015-06-20 ENCOUNTER — Telehealth: Payer: Self-pay | Admitting: *Deleted

## 2015-06-20 DIAGNOSIS — E669 Obesity, unspecified: Secondary | ICD-10-CM

## 2015-06-20 DIAGNOSIS — I2510 Atherosclerotic heart disease of native coronary artery without angina pectoris: Secondary | ICD-10-CM

## 2015-06-20 DIAGNOSIS — E785 Hyperlipidemia, unspecified: Secondary | ICD-10-CM

## 2015-06-20 DIAGNOSIS — Z9861 Coronary angioplasty status: Secondary | ICD-10-CM

## 2015-06-20 NOTE — Telephone Encounter (Signed)
MAILED LAB SLIP AND LETTER--LIPID

## 2015-06-20 NOTE — Telephone Encounter (Signed)
-----   Message from Raiford Simmonds, RN sent at 04/12/2015 10:49 AM EDT ----- Lipids rechecked 3 months 07/2015

## 2015-07-12 LAB — LIPID PANEL
Cholesterol: 143 mg/dL (ref 125–200)
HDL: 52 mg/dL (ref >=40)
LDL CALC: 72 mg/dL (ref <130)
TRIGLYCERIDES: 96 mg/dL (ref <150)
Total CHOL/HDL Ratio: 2.8 Ratio (ref <=5.0)
VLDL: 19 mg/dL (ref <30)

## 2015-07-30 ENCOUNTER — Telehealth: Payer: Self-pay | Admitting: *Deleted

## 2015-07-30 NOTE — Telephone Encounter (Signed)
Spoke to patient. Result given . Verbalized understanding  

## 2015-07-30 NOTE — Telephone Encounter (Signed)
-----   Message from Leonie Man, MD sent at 07/30/2015  1:24 PM EDT ----- Cholesterol levels look great. LDL is pretty much at goal. Looks better than it did a year ago and doubly better than it did 4 months ago. Continue current dose of statin.  Leonie Man, MD

## 2015-07-30 NOTE — Telephone Encounter (Signed)
LEFT MESSAGE TO CALL BACK ON ANSWER MACHINE.

## 2015-10-09 ENCOUNTER — Other Ambulatory Visit: Payer: Self-pay | Admitting: Cardiology

## 2016-03-18 ENCOUNTER — Other Ambulatory Visit: Payer: Self-pay | Admitting: Cardiology

## 2016-03-19 NOTE — Telephone Encounter (Signed)
Rx request sent to pharmacy.  

## 2016-03-25 ENCOUNTER — Other Ambulatory Visit: Payer: Self-pay | Admitting: Cardiology

## 2016-03-25 NOTE — Telephone Encounter (Signed)
Rx(s) sent to pharmacy electronically.  

## 2016-03-31 ENCOUNTER — Encounter: Payer: Self-pay | Admitting: Cardiology

## 2016-03-31 ENCOUNTER — Ambulatory Visit (INDEPENDENT_AMBULATORY_CARE_PROVIDER_SITE_OTHER): Payer: BLUE CROSS/BLUE SHIELD | Admitting: Cardiology

## 2016-03-31 VITALS — BP 116/84 | HR 54 | Ht 68.0 in | Wt 191.6 lb

## 2016-03-31 DIAGNOSIS — Z9861 Coronary angioplasty status: Secondary | ICD-10-CM | POA: Diagnosis not present

## 2016-03-31 DIAGNOSIS — R7301 Impaired fasting glucose: Secondary | ICD-10-CM | POA: Diagnosis not present

## 2016-03-31 DIAGNOSIS — I2510 Atherosclerotic heart disease of native coronary artery without angina pectoris: Secondary | ICD-10-CM | POA: Diagnosis not present

## 2016-03-31 DIAGNOSIS — E785 Hyperlipidemia, unspecified: Secondary | ICD-10-CM | POA: Diagnosis not present

## 2016-03-31 DIAGNOSIS — I358 Other nonrheumatic aortic valve disorders: Secondary | ICD-10-CM

## 2016-03-31 DIAGNOSIS — E669 Obesity, unspecified: Secondary | ICD-10-CM

## 2016-03-31 DIAGNOSIS — I2111 ST elevation (STEMI) myocardial infarction involving right coronary artery: Secondary | ICD-10-CM

## 2016-03-31 DIAGNOSIS — G4733 Obstructive sleep apnea (adult) (pediatric): Secondary | ICD-10-CM

## 2016-03-31 LAB — COMPREHENSIVE METABOLIC PANEL
ALBUMIN: 4.4 g/dL (ref 3.6–5.1)
ALT: 12 U/L (ref 9–46)
AST: 16 U/L (ref 10–35)
Alkaline Phosphatase: 83 U/L (ref 40–115)
BUN: 17 mg/dL (ref 7–25)
CALCIUM: 9.7 mg/dL (ref 8.6–10.3)
CHLORIDE: 104 mmol/L (ref 98–110)
CO2: 28 mmol/L (ref 20–31)
CREATININE: 0.88 mg/dL (ref 0.70–1.25)
Glucose, Bld: 98 mg/dL (ref 65–99)
POTASSIUM: 4.9 mmol/L (ref 3.5–5.3)
Sodium: 142 mmol/L (ref 135–146)
TOTAL PROTEIN: 6.8 g/dL (ref 6.1–8.1)
Total Bilirubin: 1.2 mg/dL (ref 0.2–1.2)

## 2016-03-31 LAB — LIPID PANEL
CHOLESTEROL: 153 mg/dL (ref 125–200)
HDL: 50 mg/dL (ref >=40)
LDL Cholesterol: 80 mg/dL (ref <130)
TRIGLYCERIDES: 114 mg/dL (ref <150)
Total CHOL/HDL Ratio: 3.1 Ratio (ref <=5.0)
VLDL: 23 mg/dL (ref <30)

## 2016-03-31 LAB — HEMOGLOBIN A1C
Hgb A1c MFr Bld: 5.9 % — ABNORMAL HIGH (ref <5.7)
Mean Plasma Glucose: 123 mg/dL

## 2016-03-31 NOTE — Patient Instructions (Signed)
LIPIDS, CMP , HGBA1C -- NOTHING TO EAT OR DRINK THE MORNING OF THE TEST   NO OTHER CHANGES AT CURRENT TIME.   Your physician wants you to follow-up in [place].  You will receive a reminder letter in the mail two months in advance. If you don't receive a letter, please call our office to schedule the follow-up appointment.  If you need a refill on your cardiac medications before your next appointment, please call your pharmacy.

## 2016-03-31 NOTE — Progress Notes (Signed)
PCP: Sherrie Mustache, MD  Clinic Note: Chief Complaint  Patient presents with  . Annual Exam    CAD  pt states no Sx.    HPI: Omar [name] is a 60 y.o. male with a PMH below who presents today for 1 year follow-up after reestablishing cardiology care. He is former patient of Dr. Terance Ice. Coronary disease history began in 1997 with an inferior STEMI.  May 30, 1996 when he presented with an Inferior STEMI -- PTCA of RCA as part of the PAMI trial (randomized to PTCA & not Stent). As part of the trial he had a re-look cath the following year that showed minimal residual stenosis @ the PTCA site & has had several negative stress tests since (last one in 2009).  Omar [name] was last seen on 03/26/2015  Recent Hospitalizations: None  Studies Reviewed: None  Interval History: Stigen doing well today. As usual he has no major cardiac complaints. He is quite active, but does not do any routine exercise.  Plays golf - does quite a bit of walking. Also goes hunting.  With any activity, he denies any chest tightness or pressure or dyspnea with rest or exertion. He only notes that his heart rate gets up if he gets excited but otherwise no rapid irregular heartbeats palpitations that lead to near-syncope syncope. No TIA/amaurosis fugax. He still has problems with constipation, but denies any melena, hematochezia. No hematuria, or epstaxis. No claudication.  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  ROS: A comprehensive was performed. Review of Systems  Constitutional: Negative for malaise/fatigue.  HENT: Negative for congestion and nosebleeds.   Respiratory: Negative for sputum production, shortness of breath and wheezing.   Gastrointestinal: Positive for constipation. Negative for blood in stool and melena.  Musculoskeletal: Negative.  Negative for joint pain.  Neurological: Positive for tingling (foot tingling is  better) and tremors (sometimes if his "sugar gets up" - usually after a rich meal). Negative for dizziness, sensory change, speech change, focal weakness, seizures, loss of consciousness and headaches.  Endo/Heme/Allergies: Does not bruise/bleed easily.  Psychiatric/Behavioral: Negative.   All other systems reviewed and are negative.   Past Medical History  Diagnosis Date  . ST elevation myocardial infarction (STEMI) involving right coronary artery in recovery phase ([place]) 05/30/1996    100% occluded RCA -- PTCA-POBA (~20% residual) of RCA (part of PAMI Trial) ;; Normal EF By Echo in 2014  . CAD S/P percutaneous coronary angioplasty 05/30/1996    single -vessel disease (RCA) Rx with POBA on PAMI protocol in june 1997; Re-Look Cath 12/1996: ~20-30% residual no stenosis in 1998 with good LV Function; Myoview 6/'09:  low risk scan ,post EF 58%, this was the fourth such scan in as many years  . Dyslipidemia, goal LDL below 70     On statin  . Obstructive sleep apnea 07-19-2003    breathing disorder services,Dr. Steward Drone. 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  . Anxiety 07-19-2003    Dr.Saaat Humphrey Rolls  . Lumbosacral disc disease Feb,1 2011    ThIs information from CT done several years ago  . Aortic valve sclerosis January 2014    Noted on echocardiogram to have aortic sclerosis without stenosis. Ordered for murmur  . Chronic low back pain     Has seen Dr. Marlou Sa    Past Surgical History  Procedure Laterality Date  .  Cardiac catheterization  12-23-1996    ASHD h/o acute DMI with emergency PTCA- randomized to balloon PTCA 05-30-1996 reperfusion time 2 hrs 2 min.,single vessel coronary disease RCA  . Hernia repair  2005    remote laparscopic hernia repair by Dr.martin in may 2005  . Nm myoview ltd  05/2008    Mild anterior defect consistent with chest wall attenuation. Normal EF and no ischemia, no infarction  .  Transthoracic echocardiogram  January 2014    Mild concentric LVH. Normal systolic and diastolic function with EF 55-65%. Mild LA dilation. Aortic sclerosis without stenosis.   Prior to Admission medications   Medication Sig Start Date End Date Taking? Authorizing Provider  aspirin 81 MG tablet Take 81 mg by mouth daily.   Yes Historical Provider, MD  LINZESS 145 MCG CAPS capsule Take 145 mcg by mouth as needed. 02/21/16  Yes Historical Provider, MD  metoprolol succinate (TOPROL-XL) 25 MG 24 hr tablet TAKE 1/2 TABLET ONCE A DAY 03/19/16  Yes Leonie Man, MD  simvastatin (ZOCOR) 40 MG tablet Take 1 tablet (40 mg total) by mouth daily at 6 PM. 03/25/16  Yes Leonie Man, MD  vitamin E 400 UNIT capsule Take 400 Units by mouth daily.   Yes Historical Provider, MD    No Known Allergies  Social History   Social History  . Marital Status: Married    Spouse Name: N/A  . Number of Children: N/A  . Years of Education: N/A   Social History Main Topics  . Smoking status: Former Smoker    Quit date: 12/02/1995  . Smokeless tobacco: None  . Alcohol Use: Yes     Comment: occasionally  . Drug Use: None  . Sexual Activity: Not Asked   Other Topics Concern  . None   Social History Narrative   Married father of 2 with 3 children. He quit smoking in 1997.   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 He does drink not alcohol.   family history is not on file.  No premature CAD noted  Wt Readings from Last 3 Encounters:  03/31/16 191 lb 9.6 oz (86.909 kg)  03/26/15 197 lb 14.4 oz (89.767 kg)  08/31/14 199 lb 11.2 oz (90.583 kg)  -- plays lots of golf; watches diet   PHYSICAL EXAM BP 116/84 mmHg  Pulse 54  Ht 5\' 8"  (1.727 m)  Wt 191 lb 9.6 oz (86.909 kg)  BMI 29.14 kg/m2 General appearance: alert, cooperative, appears stated age, no distress and Borderline obese. But otherwise relatively the appearing. Answers questions appropriately. Neck: no adenopathy, no carotid bruit, no JVD and  supple, symmetrical, trachea midline Lungs: clear to auscultation bilaterally, normal percussion bilaterally and Nonlabored, good air movement Heart: normal apical impulse, S1, S2 normal, S4 present and 1-2/6 SEM at RUSB --> carotids, no rubs. Abdomen: soft, non-tender; bowel sounds normal; no masses, no organomegaly Extremities: extremities normal, atraumatic, no cyanosis or edema Pulses: 2+ and symmetric Neurologic: Grossly normal    Adult ECG Report  Rate: 564;  Rhythm: sinus bradycardia; normal axis, intervals & durations.  Narrative Interpretation: stable . No change from April 2016   Other studies Reviewed: Additional studies/ records that were reviewed today include:  Recent Labs: due to be checked today   ASSESSMENT / PLAN: Problem List Items Addressed This Visit    ST elevation myocardial infarction (STEMI) involving right coronary artery in recovery phase ([facility]) (Chronic)    I would imagine the ""diaphragmatic attenuation" noted on Myoview  may very well be prior infarct. No evidence of ischemia. Minimal restenosis on relook catheterization. No active anginal symptoms or heart failure symptoms.      Obstructive sleep apnea (Chronic)    Continues to use CPAP.      Obesity (BMI 30-39.9) (Chronic)    With the recent weight loss diet and increased exercise,he is now below the "obesity threshold". I congratulated him on his efforts      Impaired fasting glucose    In the past she has had some elevated glucose levels. Screening evaluation we will check hemoglobin A1c to assess for potential diabetes.      Relevant Orders   Hemoglobin A1c (Completed)   Comprehensive metabolic panel (Completed)   Lipid panel (Completed)   Hyperlipidemia with target LDL less than 70 (Chronic)    Labs ordered for today. Last year his lipids look good with a LDL of 72. Am hoping with his weight loss, that there will be at least stable if not improved. Labs ordered for today.       Relevant Orders   EKG 12-Lead   Hemoglobin A1c (Completed)   Comprehensive metabolic panel (Completed)   Lipid panel (Completed)   CAD S/P percutaneous coronary angioplasty - Primary (Chronic)    He really had single-vessel disease back in 1997 with PTCA only as part of the PAMI Trial.  Essentially no residual stenosis on follow-up echo. Nonischemic Myoview and follow-up. Stable astigmatic. On low-dose Toprol, aspirin and simvastatin.      Relevant Orders   EKG 12-Lead   Hemoglobin A1c (Completed)   Comprehensive metabolic panel (Completed)   Lipid panel (Completed)   Aortic valve sclerosis (Chronic)    SEM noted on exam. It is relatively soft and early peaking. Barely audible today. If the murmur becomes more noticeable, would consider rechecking echo cardiac grams to ensure that is not worsened.      Relevant Orders   EKG 12-Lead   Hemoglobin A1c (Completed)   Comprehensive metabolic panel (Completed)   Lipid panel (Completed)      Current medicines are reviewed at length with the patient today. (+/- concerns) none The following changes have been made: none Lipid panel with A1c and CMP ordered today.  ROV 1 yr  Studies Ordered:   Orders Placed This Encounter  Procedures  . Hemoglobin A1c  . Comprehensive metabolic panel  . Lipid panel  . EKG 12-Lead     Leonie Man, M.D., M.S. Interventional Cardiologist   Pager # [phone] Phone # [phone] [address] [place], [place] [address]   ADDENDUM Lab Results  Component Value Date   CHOL 153 03/31/2016   HDL 50 03/31/2016   LDLCALC 80 03/31/2016   TRIG 114 03/31/2016   CHOLHDL 3.1 03/31/2016   Lab Results  Component Value Date   HGBA1C 5.9* 03/31/2016

## 2016-04-02 DIAGNOSIS — R7301 Impaired fasting glucose: Secondary | ICD-10-CM | POA: Insufficient documentation

## 2016-04-02 NOTE — Assessment & Plan Note (Signed)
Continues to use CPAP. °

## 2016-04-02 NOTE — Assessment & Plan Note (Signed)
Labs ordered for today. Last year his lipids look good with a LDL of 72. Am hoping with his weight loss, that there will be at least stable if not improved. Labs ordered for today.

## 2016-04-02 NOTE — Assessment & Plan Note (Signed)
In the past she has had some elevated glucose levels. Screening evaluation we will check hemoglobin A1c to assess for potential diabetes.

## 2016-04-02 NOTE — Assessment & Plan Note (Signed)
With the recent weight loss diet and increased exercise,he is now below the "obesity threshold". I congratulated him on his efforts

## 2016-04-02 NOTE — Assessment & Plan Note (Signed)
He really had single-vessel disease back in 1997 with PTCA only as part of the PAMI Trial.  Essentially no residual stenosis on follow-up echo. Nonischemic Myoview and follow-up. Stable astigmatic. On low-dose Toprol, aspirin and simvastatin.

## 2016-04-02 NOTE — Assessment & Plan Note (Signed)
I would imagine the ""diaphragmatic attenuation" noted on Myoview may very well be prior infarct. No evidence of ischemia. Minimal restenosis on relook catheterization. No active anginal symptoms or heart failure symptoms.

## 2016-04-02 NOTE — Assessment & Plan Note (Signed)
SEM noted on exam. It is relatively soft and early peaking. Barely audible today. If the murmur becomes more noticeable, would consider rechecking echo cardiac grams to ensure that is not worsened.

## 2016-04-30 ENCOUNTER — Other Ambulatory Visit: Payer: Self-pay | Admitting: Cardiology

## 2016-05-01 NOTE — Telephone Encounter (Signed)
Rx(s) sent to pharmacy electronically.  

## 2016-06-26 DIAGNOSIS — M5440 Lumbago with sciatica, unspecified side: Secondary | ICD-10-CM | POA: Insufficient documentation

## 2016-07-08 ENCOUNTER — Other Ambulatory Visit (HOSPITAL_COMMUNITY): Payer: Self-pay | Admitting: Adult Health Nurse Practitioner

## 2016-07-08 ENCOUNTER — Ambulatory Visit (HOSPITAL_COMMUNITY)
Admission: RE | Admit: 2016-07-08 | Discharge: 2016-07-08 | Disposition: A | Discharge status: Home / Self Care | Payer: BLUE CROSS/BLUE SHIELD | Source: Ambulatory Visit | Attending: Adult Health Nurse Practitioner | Admitting: Adult Health Nurse Practitioner

## 2016-07-08 DIAGNOSIS — M5136 Other intervertebral disc degeneration, lumbar region: Secondary | ICD-10-CM | POA: Insufficient documentation

## 2016-07-08 DIAGNOSIS — M545 Low back pain: Secondary | ICD-10-CM | POA: Diagnosis present

## 2016-07-15 ENCOUNTER — Other Ambulatory Visit (HOSPITAL_COMMUNITY): Payer: Self-pay | Admitting: Adult Health Nurse Practitioner

## 2016-07-15 DIAGNOSIS — M545 Low back pain, unspecified: Secondary | ICD-10-CM

## 2016-07-25 ENCOUNTER — Ambulatory Visit (HOSPITAL_COMMUNITY): Payer: BLUE CROSS/BLUE SHIELD

## 2016-11-19 ENCOUNTER — Telehealth (INDEPENDENT_AMBULATORY_CARE_PROVIDER_SITE_OTHER): Payer: Self-pay | Admitting: Physical Medicine and Rehabilitation

## 2016-11-19 NOTE — Telephone Encounter (Signed)
yes

## 2016-11-19 NOTE — Telephone Encounter (Signed)
Called patient and left a message for him to call back for scheduling.

## 2016-11-19 NOTE — Telephone Encounter (Signed)
No precert required for [facility]

## 2016-12-08 ENCOUNTER — Ambulatory Visit (INDEPENDENT_AMBULATORY_CARE_PROVIDER_SITE_OTHER): Payer: BLUE CROSS/BLUE SHIELD | Admitting: Physical Medicine and Rehabilitation

## 2016-12-08 ENCOUNTER — Encounter (INDEPENDENT_AMBULATORY_CARE_PROVIDER_SITE_OTHER): Payer: Self-pay | Admitting: Physical Medicine and Rehabilitation

## 2016-12-08 VITALS — BP 125/88 | HR 90 | Temp 98.7°F

## 2016-12-08 DIAGNOSIS — M5116 Intervertebral disc disorders with radiculopathy, lumbar region: Secondary | ICD-10-CM | POA: Diagnosis not present

## 2016-12-08 DIAGNOSIS — M5416 Radiculopathy, lumbar region: Secondary | ICD-10-CM | POA: Diagnosis not present

## 2016-12-08 MED ORDER — METHYLPREDNISOLONE ACETATE 80 MG/ML IJ SUSP
80.0000 mg | Freq: Once | INTRAMUSCULAR | Status: AC
  Administered 2016-12-08: 80 mg

## 2016-12-08 MED ORDER — LIDOCAINE HCL (PF) 1 % IJ SOLN: 0.3300 mL | Freq: Once | INTRAMUSCULAR | Status: DC

## 2016-12-08 NOTE — Procedures (Signed)
Lumbosacral Transforaminal Epidural Steroid Injection - Infraneural Approach with Fluoroscopic Guidance  Patient: Omar [name]      Date of Birth: 10/09/1956 MRN: [id number] PCP: Sherrie Mustache, MD      Visit Date: 12/08/2016   Universal Protocol:    Date/Time: 01/08/188:26 AM  Consent Given By: the patient  Position: PRONE   Additional Comments: Vital signs were monitored before and after the procedure. Patient was prepped and draped in the usual sterile fashion. The correct patient, procedure, and site was verified.   Injection Procedure Details:  Procedure Site One Meds Administered:  Meds ordered this encounter  Medications  . lidocaine (PF) (XYLOCAINE) 1 % injection 0.3 mL  . methylPREDNISolone acetate (DEPO-MEDROL) injection 80 mg      Laterality: Left  Location/Site:  L3-L4  Needle size: 22 G  Needle type: Spinal  Needle Placement: Transforaminal  Findings:  -Contrast Used: 1 mL iohexol 180 mg iodine/mL   -Comments: Excellent flow of contrast along the nerve and into the epidural space.  Procedure Details: After squaring off the end-plates of the desired vertebral level to get a true AP view, the C-arm was obliqued to the painful side so that the superior articulating process is positioned about 1/3 the length of the inferior endplate.  The needle was aimed toward the junction of the superior articular process and the transverse process of the inferior vertebrae. The needle's initial entry is in the lower third of the foramen through Kambin's triangle. The soft tissues overlying this target were infiltrated with 2-3 ml. of 1% Lidocaine without Epinephrine.  The spinal needle was then inserted and advanced toward the target using a "trajectory" view along the fluoroscope beam.  Under AP and lateral visualization, the needle was advanced so it did not puncture dura and did not traverse medially beyond the 6 o'clock position of the pedicle. Bi-planar  projections were used to confirm position. Aspiration was confirmed to be negative for CSF and/or blood. A 1-2 ml. volume of Isovue-250 was injected and flow of contrast was noted at each level. Radiographs were obtained for documentation purposes.   After attaining the desired flow of contrast documented above, a 0.5 to 1.0 ml test dose of 0.25% Marcaine was injected into each respective transforaminal space.  The patient was observed for 90 seconds post injection.  After no sensory deficits were reported, and normal lower extremity motor function was noted,   the above injectate was administered so that equal amounts of the injectate were placed at each foramen (level) into the transforaminal epidural space.   Additional Comments:  The patient tolerated the procedure well Dressing: Band-Aid    Post-procedure details: Patient was observed during the procedure. Post-procedure instructions were reviewed.  Patient left the clinic in stable condition.

## 2016-12-08 NOTE — Progress Notes (Signed)
DAEMIAN BINGER - 61 y.o. male MRN [id number]  Date of birth: [birth year]  Office Visit Note: Visit Date: 12/08/2016 PCP: Sherrie Mustache, MD Referred by: Dione Housekeeper, MD  Subjective: Chief Complaint  Patient presents with  . Lower Back - Pain   HPI: Mr. Hewitt Blade is a 61 year old gentleman I saw back in August 2 had an L2-3 paracentral disc herniation with L3 and L4 type symptoms. He comes in today with Right sided lower back pain. Left leg numbness. No numbness or tingling in right leg. Comes and goes. Reports pain level of about a 6. Pain really goes across the lower back. Worse with standing and ambulating and twisting. He is getting radicular complaints on the left. Last injection did help more than 50% is pretty happy. He's had no focal weakness or bowel or bladder difficulties. No new trauma.    No dye allergy, no blood thinners, has driver.   ROS Otherwise per HPI.  Assessment & Plan: Visit Diagnoses:  1. Lumbar radiculopathy   2. Radiculopathy due to lumbar intervertebral disc disorder     Plan: Findings:  Chronic worsening axial low back pain little bit more right than left with left radicular pain. Prior history of L2-3 herniated disc which was left paracentral. These can hurt across the spine at 2 mother more one side or the other. He did well with left L3 transforaminal injection in the past but a repeat that today. He is going to go golfing in the next few days and I think that's fine. He has had good conservative care otherwise and this did help in the past. If it does not help as much as he would like to do think part of this is facet mediated pain particularly at L4-5 where he has pretty significant osteoarthritis. I would consider bilateral or right-sided L4-5 facet joint injection. He might ultimately be a candidate for radiofrequency ablation.    Meds & Orders:  Meds ordered this encounter  Medications  . lidocaine (PF) (XYLOCAINE) 1 % injection 0.3  mL  . methylPREDNISolone acetate (DEPO-MEDROL) injection 80 mg    Orders Placed This Encounter  Procedures  . Epidural Steroid injection    Follow-up: Return if symptoms worsen or fail to improve her 2 weeks, for Possible L4-5 facet joint block.   Procedures: No procedures performed  Lumbosacral Transforaminal Epidural Steroid Injection - Infraneural Approach with Fluoroscopic Guidance  Patient: DEONTEZ EASTERLY      Date of Birth: 10/03/1956 MRN: [id number] PCP: Sherrie Mustache, MD      Visit Date: 12/08/2016   Universal Protocol:    Date/Time: 01/08/188:26 AM  Consent Given By: the patient  Position: PRONE   Additional Comments: Vital signs were monitored before and after the procedure. Patient was prepped and draped in the usual sterile fashion. The correct patient, procedure, and site was verified.   Injection Procedure Details:  Procedure Site One Meds Administered:  Meds ordered this encounter  Medications  . lidocaine (PF) (XYLOCAINE) 1 % injection 0.3 mL  . methylPREDNISolone acetate (DEPO-MEDROL) injection 80 mg      Laterality: Left  Location/Site:  L3-L4  Needle size: 22 G  Needle type: Spinal  Needle Placement: Transforaminal  Findings:  -Contrast Used: 1 mL iohexol 180 mg iodine/mL   -Comments: Excellent flow of contrast along the nerve and into the epidural space.  Procedure Details: After squaring off the end-plates of the desired vertebral level to get a true AP view, the C-arm was obliqued to  the painful side so that the superior articulating process is positioned about 1/3 the length of the inferior endplate.  The needle was aimed toward the junction of the superior articular process and the transverse process of the inferior vertebrae. The needle's initial entry is in the lower third of the foramen through Kambin's triangle. The soft tissues overlying this target were infiltrated with 2-3 ml. of 1% Lidocaine without Epinephrine.  The  spinal needle was then inserted and advanced toward the target using a "trajectory" view along the fluoroscope beam.  Under AP and lateral visualization, the needle was advanced so it did not puncture dura and did not traverse medially beyond the 6 o'clock position of the pedicle. Bi-planar projections were used to confirm position. Aspiration was confirmed to be negative for CSF and/or blood. A 1-2 ml. volume of Isovue-250 was injected and flow of contrast was noted at each level. Radiographs were obtained for documentation purposes.   After attaining the desired flow of contrast documented above, a 0.5 to 1.0 ml test dose of 0.25% Marcaine was injected into each respective transforaminal space.  The patient was observed for 90 seconds post injection.  After no sensory deficits were reported, and normal lower extremity motor function was noted,   the above injectate was administered so that equal amounts of the injectate were placed at each foramen (level) into the transforaminal epidural space.   Additional Comments:  The patient tolerated the procedure well Dressing: Band-Aid    Post-procedure details: Patient was observed during the procedure. Post-procedure instructions were reviewed.  Patient left the clinic in stable condition.   Clinical History: Lspine MRI 07/17/2016 IMPRESSION: Left paracentral disc protrusion at L2-3 with encroachment on the traversing left L3 nerve root.  Multilevel degenerative disc disease and facet arthrosis. Moderate central canal and bilateral subarticular recess stenosis at L4-5  Mild bilateral foraminal stenosis L4-5 and L5-S1  Moderately severe right lateral recess and foraminal stenosis at L3-4  Mild levoconvex scoliosis  He reports that he quit smoking about 21 years ago. He has quit using smokeless tobacco.   Recent Labs  03/31/16 0914  HGBA1C 5.9*    Objective:  VS:  HT:    WT:   BMI:     BP:125/88  HR:90bpm  TEMP:98.7 F (37.1  C)(Oral)  RESP:96 % Physical Exam  Constitutional: He is oriented to person, place, and time.  Musculoskeletal:  Patient has good distal strength without any deficits. No clonus. Ambulates without aid with a forward flexed spine. Pain with extension rotation.  Neurological: He is alert and oriented to person, place, and time. He exhibits normal muscle tone. Coordination normal.    Ortho Exam Imaging: No results found.  Past Medical/Family/Surgical/Social History: Medications & Allergies reviewed per EMR Patient Active Problem List   Diagnosis Date Noted  . Impaired fasting glucose 04/02/2016  . Obesity (BMI 30-39.9) 08/31/2014  . Hyperlipidemia with target LDL less than 70   . Aortic valve sclerosis 12/01/2012  . Obstructive sleep apnea 07/19/2003  . ST elevation myocardial infarction (STEMI) involving right coronary artery in recovery phase ([place]) 05/30/1996  . CAD S/P percutaneous coronary angioplasty 05/30/1996   Past Medical History:  Diagnosis Date  . Anxiety 07-19-2003   Dr.Saaat Humphrey Rolls  . Aortic valve sclerosis January 2014   Noted on echocardiogram to have aortic sclerosis without stenosis. Ordered for murmur  . CAD S/P percutaneous coronary angioplasty 05/30/1996   single -vessel disease (RCA) Rx with POBA on PAMI protocol in june 1997; Re-Look  Cath 12/1996: ~20-30% residual no stenosis in 1998 with good LV Function; Myoview 6/'09:  low risk scan ,post EF 58%, this was the fourth such scan in as many years  . Chronic low back pain    Has seen Dr. Marlou Sa  . Dyslipidemia, goal LDL below 70    On statin  . Lumbosacral disc disease Feb,1 2011   ThIs information from CT done several years ago  . Obstructive sleep apnea 07-19-2003   breathing disorder services,Dr. Steward Drone. 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  . ST elevation myocardial infarction (STEMI) involving right coronary artery in  recovery phase ([place]) 05/30/1996   100% occluded RCA -- PTCA-POBA (~20% residual) of RCA (part of PAMI Trial) ;; Normal EF By Echo in 2014   History reviewed. No pertinent family history. Past Surgical History:  Procedure Laterality Date  . CARDIAC CATHETERIZATION  12-23-1996   ASHD h/o acute DMI with emergency PTCA- randomized to balloon PTCA 05-30-1996 reperfusion time 2 hrs 2 min.,single vessel coronary disease RCA  . HERNIA REPAIR  2005   remote laparscopic hernia repair by Dr.martin in may 2005  . NM MYOVIEW LTD  05/2008   Mild anterior defect consistent with chest wall attenuation. Normal EF and no ischemia, no infarction  . TRANSTHORACIC ECHOCARDIOGRAM  January 2014   Mild concentric LVH. Normal systolic and diastolic function with EF 55-65%. Mild LA dilation. Aortic sclerosis without stenosis.   Social History   Occupational History  . Not on file.   Social History Main Topics  . Smoking status: Former Smoker    Quit date: 12/02/1995  . Smokeless tobacco: Former Systems developer  . Alcohol use Yes     Comment: occasionally  . Drug use: Unknown  . Sexual activity: Not on file

## 2016-12-08 NOTE — Patient Instructions (Signed)

## 2017-03-31 ENCOUNTER — Encounter: Payer: Self-pay | Admitting: Cardiology

## 2017-03-31 ENCOUNTER — Ambulatory Visit (INDEPENDENT_AMBULATORY_CARE_PROVIDER_SITE_OTHER): Payer: BLUE CROSS/BLUE SHIELD | Admitting: Cardiology

## 2017-03-31 VITALS — BP 138/80 | HR 66 | Ht 69.0 in | Wt 188.4 lb

## 2017-03-31 DIAGNOSIS — Z9861 Coronary angioplasty status: Secondary | ICD-10-CM

## 2017-03-31 DIAGNOSIS — E669 Obesity, unspecified: Secondary | ICD-10-CM | POA: Diagnosis not present

## 2017-03-31 DIAGNOSIS — I10 Essential (primary) hypertension: Secondary | ICD-10-CM | POA: Insufficient documentation

## 2017-03-31 DIAGNOSIS — E785 Hyperlipidemia, unspecified: Secondary | ICD-10-CM

## 2017-03-31 DIAGNOSIS — I358 Other nonrheumatic aortic valve disorders: Secondary | ICD-10-CM | POA: Diagnosis not present

## 2017-03-31 DIAGNOSIS — K5901 Slow transit constipation: Secondary | ICD-10-CM | POA: Insufficient documentation

## 2017-03-31 DIAGNOSIS — I2111 ST elevation (STEMI) myocardial infarction involving right coronary artery: Secondary | ICD-10-CM

## 2017-03-31 DIAGNOSIS — I2510 Atherosclerotic heart disease of native coronary artery without angina pectoris: Secondary | ICD-10-CM

## 2017-03-31 LAB — CMP 10231
AG Ratio: 1.8 Ratio (ref 1.0–2.5)
ALT: 14 U/L (ref 9–46)
AST: 17 U/L (ref 10–35)
Albumin: 4.4 g/dL (ref 3.6–5.1)
Alkaline Phosphatase: 73 U/L (ref 40–115)
BUN/Creatinine Ratio: 14.6 Ratio (ref 6–22)
BUN: 13 mg/dL (ref 7–25)
CALCIUM: 9.5 mg/dL (ref 8.6–10.3)
CO2: 26 mmol/L (ref 20–31)
CREATININE: 0.89 mg/dL (ref 0.70–1.25)
Chloride: 106 mmol/L (ref 98–110)
GFR, Est African American: >89 mL/min (ref >=60)
GFR, Est Non African American: >89 mL/min (ref >=60)
GLOBULIN: 2.4 g/dL (ref 1.9–3.7)
GLUCOSE: 91 mg/dL (ref 65–99)
Potassium: 5.1 mmol/L (ref 3.5–5.3)
Sodium: 143 mmol/L (ref 135–146)
Total Bilirubin: 1.1 mg/dL (ref 0.2–1.2)
Total Protein: 6.8 g/dL (ref 6.1–8.1)

## 2017-03-31 LAB — MAGNESIUM: Magnesium: 2.1 mg/dL (ref 1.5–2.5)

## 2017-03-31 LAB — LIPID PANEL
Cholesterol: 139 mg/dL (ref <200)
HDL: 41 mg/dL (ref >40)
LDL CALC: 69 mg/dL (ref <100)
TRIGLYCERIDES: 146 mg/dL (ref <150)
Total CHOL/HDL Ratio: 3.4 Ratio (ref <5.0)
VLDL: 29 mg/dL (ref <30)

## 2017-03-31 NOTE — Patient Instructions (Addendum)
NO CHANGE IN CURRENT MEDICATIONS   LABS- DO NOT EAT OR DRINK THE MORNING OF THE TEST CMP, LIPID MAGNESIUM   PURCHASE  MAGNESIUM CITRATE FOR CONSTIPATION, IF IT DOES NOT  WORK CONTACT PRIMARY.   Your physician wants you to follow-up in [place].You will receive a reminder letter in the mail two months in advance. If you don't receive a letter, please call our office to schedule the follow-up appointment.   If you need a refill on your cardiac medications before your next appointment, please call your pharmacy.

## 2017-03-31 NOTE — Assessment & Plan Note (Signed)
Distant history of single vessel CAD with PTCA only as part of the PAMI trial. Nonischemic Myoview and follow-up suggesting no significant restenosis. Continues to be asymptomatic on low-dose beta blocker, aspirin and statin.  No change

## 2017-03-31 NOTE — Assessment & Plan Note (Signed)
Really this was the mainly he was concerned about. He has tried lots of things I recommended trying milk of magnesia/magnesium citrate to see if this would help. Otherwise I would strongly consider GI evaluation if this continues to be an issue.

## 2017-03-31 NOTE — Assessment & Plan Note (Signed)
He is on simvastatin. Is due for labs to be checked now. He was close to goal last check.

## 2017-03-31 NOTE — Assessment & Plan Note (Signed)
Exam is pretty stable with no real change to his murmur. Would only check recheck echo if his echo were to get louder.

## 2017-03-31 NOTE — Assessment & Plan Note (Signed)
Previous Myoview the red "diaphragmatic attenuation which may very well be the after effect of his MI. Thankfully though no evidence ischemia and preserved EF. On stable regimen not requiring much.

## 2017-03-31 NOTE — Assessment & Plan Note (Signed)
He has done well with weight loss down about 10 pounds in 2 years. No longer in the obese category.

## 2017-03-31 NOTE — Progress Notes (Signed)
PCP: Sherrie Mustache, MD  Clinic Note: Chief Complaint  Patient presents with  . Annual Exam    History of CAD    HPI: Omar [name] is a 61 y.o. male with a PMH below who presents today for annual f/u for CAD-STEMI-PCI He is former patient of Dr. Terance Ice. Coronary disease history began in 1997 with an inferior STEMI.  May 30, 1996 when he presented with an Inferior STEMI -- PTCA of RCA as part of the PAMI trial (randomized to PTCA & not Stent). As part of the trial he had a re-look cath the following year that showed minimal residual stenosis @ the PTCA site & has had several negative stress tests since (last one in 2009).  Omar [name] was last seen on 03/31/2016 - doing well. NO complaints.  Plays golf - does quite a bit of walking. Also goes hunting.  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  Recent Hospitalizations: none  Studies Personally Reviewed - if available, images/films reviewed: From Epic Chart or Care EveryWhere -- none  Interval History: Kariem presents today without any major cardiac complaints. He denies any Chest tightness or pressure with rest or exertion. No PND, orthopnea or edema. Cardiac Review of Symptoms: No palpitations, lightheadedness, dizziness, weakness or syncope/near syncope. No TIA/amaurosis fugax symptoms. No melena, hematochezia, hematuria, or epstaxis. No claudication.  His most notable complaints are constipation and gas. He only notes mild low was told that when he wipes vigorously otherwise no blood.  ROS: A comprehensive was performed. Review of Systems  Constitutional: Negative for malaise/fatigue.  HENT: Negative for congestion.   Respiratory: Negative for cough, shortness of breath and wheezing.   Cardiovascular:       Negative per HPI  Gastrointestinal: Positive for constipation (only goes 1/6-7 days -- & then goes all day).       Gas / flatulence    Musculoskeletal: Positive for myalgias (nighttime cramps). Negative for falls.  Endo/Heme/Allergies: Positive for environmental allergies.  Psychiatric/Behavioral: Negative for memory loss.  All other systems reviewed and are negative.   have reviewed and (if needed) personally updated the patient's problem list, medications, allergies, past medical and surgical history, social and family history.   Past Medical History:  Diagnosis Date  . Anxiety 07-19-2003   Dr.Saaat Humphrey Rolls  . Aortic valve sclerosis January 2014   Noted on echocardiogram to have aortic sclerosis without stenosis. Ordered for murmur  . CAD S/P percutaneous coronary angioplasty 05/30/1996   single -vessel disease (RCA) Rx with POBA on PAMI protocol in june 1997; Re-Look Cath 12/1996: ~20-30% residual no stenosis in 1998 with good LV Function; Myoview 6/'09:  low risk scan ,post EF 58%, this was the fourth such scan in as many years  . Chronic low back pain    Has seen Dr. Marlou Sa  . Dyslipidemia, goal LDL below 70    On statin  . Lumbosacral disc disease Feb,1 2011   ThIs information from CT done several years ago  . Obstructive sleep apnea 07-19-2003   breathing disorder services,Dr. Steward Drone. 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  . ST elevation myocardial infarction (STEMI) involving right coronary artery in recovery phase ([place]) 05/30/1996   100% occluded RCA -- PTCA-POBA (~20% residual) of RCA (part of PAMI Trial) ;; Normal EF By Echo in 2014    Past Surgical History:  Procedure  Laterality Date  . CARDIAC CATHETERIZATION  12-23-1996   ASHD h/o acute DMI with emergency PTCA- randomized to balloon PTCA 05-30-1996 reperfusion time 2 hrs 2 min.,single vessel coronary disease RCA  . HERNIA REPAIR  2005   remote laparscopic hernia repair by Dr.martin in may 2005  . NM MYOVIEW LTD  05/2008   Mild anterior defect consistent with chest wall  attenuation. Normal EF and no ischemia, no infarction  . TRANSTHORACIC ECHOCARDIOGRAM  January 2014   Mild concentric LVH. Normal systolic and diastolic function with EF 55-65%. Mild LA dilation. Aortic sclerosis without stenosis.    Current Meds  Medication Sig  . aspirin 81 MG tablet Take 81 mg by mouth daily.  Marland Kitchen linaclotide (LINZESS) 145 MCG CAPS capsule Take by mouth.  . metoprolol succinate (TOPROL-XL) 25 MG 24 hr tablet TAKE 1/2 TABLET ONCE A DAY  . simvastatin (ZOCOR) 40 MG tablet TAKE 1 TABLET (40 MG TOTAL) BY MOUTH DAILY AT 6 PM.  . vitamin E 400 UNIT capsule Take 400 Units by mouth daily.   Current Facility-Administered Medications for the 03/31/17 encounter (Office Visit) with Leonie Man, MD  Medication  . lidocaine (PF) (XYLOCAINE) 1 % injection 0.3 mL    No Known Allergies  Social History   Social History  . Marital status: Married    Spouse name: N/A  . Number of children: N/A  . Years of education: N/A   Social History Main Topics  . Smoking status: Former Smoker    Quit date: 12/02/1995  . Smokeless tobacco: Former Systems developer  . Alcohol use Yes     Comment: occasionally  . Drug use: Unknown  . Sexual activity: Not Asked   Other Topics Concern  . None   Social History Narrative   Married father of 2 with 3 children. He quit smoking in 1997.   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 He does drink not alcohol.    family history is not on file.  Wt Readings from Last 3 Encounters:  03/31/17 188 lb 6.4 oz (85.5 kg)  03/31/16 191 lb 9.6 oz (86.9 kg)  03/26/15 197 lb 14.4 oz (89.8 kg)    PHYSICAL EXAM BP 138/80   Pulse 66   Ht 5\' 9"  (1.753 m)   Wt 188 lb 6.4 oz (85.5 kg)   BMI 27.82 kg/m  General appearance: alert, cooperative, appears stated age, no distress and Borderline obese. Otherwise healthy-appearing  Neck: no adenopathy, no carotid bruit and no JVD Lungs: clear to auscultation bilaterally, normal percussion bilaterally and non-labored Heart:  regular rate and rhythm, S1 & S2 normal,  cannot exclude S4 gallop. Also there is a 1-2/6 SEM at RUSB -- carotids. No rub Abdomen: soft, non-tender; bowel sounds normal; no masses,  no organomegaly;  Extremities: extremities normal, atraumatic, no cyanosis, and edema  Pulses: 2+ and symmetric;  Skin: mobility and turgor normal, no lesions noted, temperature normal and texture normal or  Neurologic: Mental status: Alert, oriented, thought content appropriate; pleasant mood and affect   Adult ECG Report  Rate: 66 ;  Rhythm: normal sinus rhythm and Normal axis, intervals and durations;   Narrative Interpretation: Normal EKG   Other studies Reviewed: Additional studies/ records that were reviewed today include:  Recent Labs:  Lab Results  Component Value Date   CHOL 153 03/31/2016   HDL 50 03/31/2016   LDLCALC 80 03/31/2016   TRIG 114 03/31/2016   CHOLHDL 3.1 03/31/2016    ASSESSMENT / PLAN: Problem List  Items Addressed This Visit    Aortic valve sclerosis (Chronic)    Exam is pretty stable with no real change to his murmur. Would only check recheck echo if his echo were to get louder.      CAD S/P percutaneous coronary angioplasty - Primary (Chronic)    Distant history of single vessel CAD with PTCA only as part of the PAMI trial. Nonischemic Myoview and follow-up suggesting no significant restenosis. Continues to be asymptomatic on low-dose beta blocker, aspirin and statin.  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 He has tried lots of things I recommended trying milk of magnesia/magnesium citrate to see if this would help. Otherwise I would strongly consider GI evaluation if this continues to be an issue.      Relevant Orders   Magnesium   Essential hypertension (Chronic)   Relevant Orders   Comprehensive metabolic  panel   History of acute inferior wall MI (Chronic)    Previous Myoview the red "diaphragmatic attenuation which may very well be the after effect of his MI. Thankfully though no evidence ischemia and preserved EF. On stable regimen not requiring much.      Hyperlipidemia with target LDL less than 70 (Chronic)    He is on simvastatin. Is due for labs to be checked now. He was close to goal last check.      Relevant Orders   EKG 12-Lead   Comprehensive metabolic panel   Lipid panel   Obesity (BMI 30-39.9) (Chronic)    He has done well with weight loss down about 10 pounds in 2 years. No longer in the obese category.         Current medicines are reviewed at length with the patient today. (+/- concerns) None The following changes have been made: None  Patient Instructions  NO CHANGE IN CURRENT MEDICATIONS   LABS- DO NOT EAT OR DRINK THE MORNING OF THE TEST CMP, LIPID MAGNESIUM   PURCHASE  MAGNESIUM CITRATE FOR CONSTIPATION, IF IT DOES NOT  WORK CONTACT PRIMARY.   Your physician wants you to follow-up in [place].You will receive a reminder letter in the mail two months in advance. If you don't receive a letter, please call our office to schedule the follow-up appointment.   If you need a refill on your cardiac medications before your next appointment, please call your pharmacy.    Studies Ordered:   Orders Placed This Encounter  Procedures  . Comprehensive metabolic panel  . Lipid panel  . Magnesium  . EKG 12-Lead      Glenetta Hew, M.D., M.S. Interventional Cardiologist   Pager # [phone] Phone # [phone] [address] [place], [place] [address]

## 2017-04-07 ENCOUNTER — Telehealth: Payer: Self-pay | Admitting: *Deleted

## 2017-04-07 NOTE — Telephone Encounter (Signed)
Unable to leave message - mailbox has not been set up

## 2017-04-09 ENCOUNTER — Other Ambulatory Visit: Payer: Self-pay | Admitting: *Deleted

## 2017-04-09 MED ORDER — SIMVASTATIN 40 MG PO TABS: 40.0000 mg | ORAL_TABLET | Freq: Every day | ORAL | 10 refills | Status: DC

## 2017-04-09 MED ORDER — METOPROLOL SUCCINATE ER 25 MG PO TB24: 12.5000 mg | ORAL_TABLET | Freq: Every day | ORAL | 10 refills | Status: DC

## 2017-04-29 NOTE — Telephone Encounter (Signed)
-----   Message from Leonie Man, MD sent at 04/07/2017  1:31 PM EDT ----- Lab results: Magnesium normal. Cholesterol panel: Normal/at goal. Continue current medications. Chemistry panel: Essentially normal electrolytes (potassium is just a little on the high side - but stable) with normal kidney function and liver function.  Glenetta Hew, MD  Please forward PCP: Dr. Meliton Rattan

## 2017-04-29 NOTE — Telephone Encounter (Signed)
SPOKE TO WIFE . (PER DPI,) PATIENT NOT HOME.  VERBALIZED UNDERSTANDING .  WILL NOTIFY PATIENT.

## 2017-05-19 ENCOUNTER — Telehealth (INDEPENDENT_AMBULATORY_CARE_PROVIDER_SITE_OTHER): Payer: Self-pay | Admitting: Physical Medicine and Rehabilitation

## 2017-05-19 NOTE — Telephone Encounter (Signed)
yes

## 2017-05-20 NOTE — Telephone Encounter (Signed)
Scheduled for 05/27/17 at 1445

## 2017-05-27 ENCOUNTER — Encounter (INDEPENDENT_AMBULATORY_CARE_PROVIDER_SITE_OTHER): Payer: Self-pay | Admitting: Physical Medicine and Rehabilitation

## 2017-05-27 ENCOUNTER — Ambulatory Visit (INDEPENDENT_AMBULATORY_CARE_PROVIDER_SITE_OTHER): Payer: BLUE CROSS/BLUE SHIELD | Admitting: Physical Medicine and Rehabilitation

## 2017-05-27 ENCOUNTER — Ambulatory Visit (INDEPENDENT_AMBULATORY_CARE_PROVIDER_SITE_OTHER): Payer: BLUE CROSS/BLUE SHIELD

## 2017-05-27 VITALS — BP 140/86 | HR 72 | Temp 98.0°F

## 2017-05-27 DIAGNOSIS — M5416 Radiculopathy, lumbar region: Secondary | ICD-10-CM

## 2017-05-27 MED ORDER — LIDOCAINE HCL (PF) 1 % IJ SOLN
2.0000 mL | Freq: Once | INTRAMUSCULAR | Status: AC
  Administered 2017-05-27: 2 mL

## 2017-05-27 MED ORDER — METHYLPREDNISOLONE ACETATE 80 MG/ML IJ SUSP
80.0000 mg | Freq: Once | INTRAMUSCULAR | Status: AC
  Administered 2017-05-27: 80 mg

## 2017-05-27 NOTE — Patient Instructions (Signed)

## 2017-05-27 NOTE — Progress Notes (Deleted)
Increased pain in the last several weeks. Left side low back. Pain and numbness down left leg to knee.

## 2017-05-29 NOTE — Procedures (Signed)
Lumbosacral Transforaminal Epidural Steroid Injection - Infraneural Approach with Fluoroscopic Guidance  Patient: Omar [name]      Date of Birth: 08-22-1956 MRN: [id number] PCP: Dione Housekeeper, MD      Visit Date: 05/27/2017   Omar [name] is a 61 year old gentleman with a history of chronic radicular pain is consistently at L3 distribution in the file leg. He's had increasing symptoms over the last couple of weeks consistent with the same pattern. Prior imaging has shown disc herniation and lateral recess stenosis at L2-3 with symptoms of the L3 region. Last injection was in January he got really good relief up until a couple weeks ago. He has had progressive worsening over the last several weeks but 2 weeks of severe pain. This has not been relieved with normal conservative care these done in the past. Because he is done so well we are going to complete a diagnostic L3 transforaminal injection. Depending on relief would look at potentially updating her imaging. On exam he has no strength deficits and he does ambulate without a cane.  Universal Protocol:     Consent Given By: the patient  Position: PRONE   Additional Comments: Vital signs were monitored before and after the procedure. Patient was prepped and draped in the usual sterile fashion. The correct patient, procedure, and site was verified.   Injection Procedure Details:  Procedure Site One Meds Administered:  Meds ordered this encounter  Medications  . lidocaine (PF) (XYLOCAINE) 1 % injection 2 mL  . methylPREDNISolone acetate (DEPO-MEDROL) injection 80 mg      Laterality: Left  Location/Site:  L3-L4  Needle size: 22 G  Needle type: Spinal  Needle Placement: Transforaminal  Findings:  -Contrast Used: 1 mL iohexol 180 mg iodine/mL   -Comments: Excellent flow of contrast along the nerve and into the epidural space.  Procedure Details: After squaring off the end-plates of the desired vertebral level to  get a true AP view, the C-arm was obliqued to the painful side so that the superior articulating process is positioned about 1/3 the length of the inferior endplate.  The needle was aimed toward the junction of the superior articular process and the transverse process of the inferior vertebrae. The needle's initial entry is in the lower third of the foramen through Kambin's triangle. The soft tissues overlying this target were infiltrated with 2-3 ml. of 1% Lidocaine without Epinephrine.  The spinal needle was then inserted and advanced toward the target using a "trajectory" view along the fluoroscope beam.  Under AP and lateral visualization, the needle was advanced so it did not puncture dura and did not traverse medially beyond the 6 o'clock position of the pedicle. Bi-planar projections were used to confirm position. Aspiration was confirmed to be negative for CSF and/or blood. A 1-2 ml. volume of Isovue-250 was injected and flow of contrast was noted at each level. Radiographs were obtained for documentation purposes.   After attaining the desired flow of contrast documented above, a 0.5 to 1.0 ml test dose of 0.25% Marcaine was injected into each respective transforaminal space.  The patient was observed for 90 seconds post injection.  After no sensory deficits were reported, and normal lower extremity motor function was noted,   the above injectate was administered so that equal amounts of the injectate were placed at each foramen (level) into the transforaminal epidural space.   Additional Comments:  The patient tolerated the procedure well Dressing: Band-Aid    Post-procedure details: Patient was observed during the  procedure. Post-procedure instructions were reviewed.  Patient left the clinic in stable condition.

## 2018-01-26 ENCOUNTER — Encounter (INDEPENDENT_AMBULATORY_CARE_PROVIDER_SITE_OTHER): Payer: Self-pay | Admitting: *Deleted

## 2018-03-31 ENCOUNTER — Encounter: Payer: Self-pay | Admitting: Cardiology

## 2018-03-31 ENCOUNTER — Ambulatory Visit: Payer: BLUE CROSS/BLUE SHIELD | Admitting: Cardiology

## 2018-03-31 VITALS — BP 132/62 | HR 59 | Ht 69.0 in | Wt 189.2 lb

## 2018-03-31 DIAGNOSIS — E785 Hyperlipidemia, unspecified: Secondary | ICD-10-CM | POA: Diagnosis not present

## 2018-03-31 DIAGNOSIS — I252 Old myocardial infarction: Secondary | ICD-10-CM

## 2018-03-31 DIAGNOSIS — I2510 Atherosclerotic heart disease of native coronary artery without angina pectoris: Secondary | ICD-10-CM | POA: Diagnosis not present

## 2018-03-31 DIAGNOSIS — R739 Hyperglycemia, unspecified: Secondary | ICD-10-CM

## 2018-03-31 DIAGNOSIS — G4733 Obstructive sleep apnea (adult) (pediatric): Secondary | ICD-10-CM

## 2018-03-31 DIAGNOSIS — Z9861 Coronary angioplasty status: Secondary | ICD-10-CM

## 2018-03-31 DIAGNOSIS — I10 Essential (primary) hypertension: Secondary | ICD-10-CM | POA: Diagnosis not present

## 2018-03-31 DIAGNOSIS — I358 Other nonrheumatic aortic valve disorders: Secondary | ICD-10-CM

## 2018-03-31 NOTE — Progress Notes (Signed)
PCP: Dione Housekeeper, MD  Clinic Note: Chief Complaint  Patient presents with  . Follow-up    pt states no concerns, pt denies chest pains, swelling in hands/feet, some slight SOB when doing activites    HPI: Omar [name] is a 62 y.o. male with a PMH below who presents today for annual f/u for CAD-STEMI-PCI.  He is former patient of Dr. Terance Ice. Coronary disease history began in 1997 with an inferior STEMI.  May 30, 1996 when he presented with an Inferior STEMI -- PTCA of RCA as part of the PAMI trial (randomized to PTCA & not Stent). As part of the trial he had a re-look cath the following year that showed minimal residual stenosis @ the PTCA site & has had several negative stress tests since (last one in 2009).  Omar [name] was last seen on 03/31/2017 -he was doing well without any major complaints.  Still noted constipation and gas./Indigestion   Recent Hospitalizations: n/a  Studies Personally Reviewed - (if available, images/films reviewed: From Epic Chart or Care Everywhere)  n/a  Interval History: Omar [name] returns today for routine follow-up feeling quite well.  He really does not have any cardiac complaints.  He is relatively active and healthy.  He says he is actually feeling better from a GI standpoint since being on Linzess.  Cardiac review of symptoms essentially negative: No chest pain or shortness of breath with rest or exertion. No PND, orthopnea or edema. No palpitations, lightheadedness, dizziness, weakness or syncope/near syncope. No TIA/amaurosis fugax symptoms.  No claudication.  He does have sleep apnea, but is not using CPAP.  Mostly there is a cost issue but also issues nasal congestion etc.  Unfortunately, is been too long since his last evaluation and he would probably require another sleep study evaluation.  ROS: A comprehensive was performed. Review of Systems  HENT: Positive for congestion (Only associate with allergies). Negative  for nosebleeds.   Respiratory: Negative for cough and shortness of breath.   Gastrointestinal: Positive for constipation (Doing better with Linzess) and heartburn (Constipation / gas - bettter with Linzess.). Negative for blood in stool and melena.  Genitourinary: Negative for hematuria.  Musculoskeletal: Positive for joint pain (shoulders and knees) and neck pain.       Nighttime cramps off and on  Endo/Heme/Allergies: Positive for environmental allergies.  Psychiatric/Behavioral: The patient has insomnia (wakes up frequently overnight).   All other systems reviewed and are negative.  I have reviewed and (if needed) personally updated the patient's problem list, medications, allergies, past medical and surgical history, social and family history.   Past Medical History:  Diagnosis Date  . Anxiety 07-19-2003   Dr.Saaat Humphrey Rolls  . Aortic valve sclerosis January 2014   Noted on echocardiogram to have aortic sclerosis without stenosis. Ordered for murmur  . CAD S/P percutaneous coronary angioplasty 05/30/1996   single -vessel disease (RCA) Rx with POBA on PAMI protocol in june 1997; Re-Look Cath 12/1996: ~20-30% residual no stenosis in 1998 with good LV Function; Myoview 6/'09:  low risk scan ,post EF 58%, this was the fourth such scan in as many years  . Chronic low back pain    Has seen Dr. Marlou Sa  . Dyslipidemia, goal LDL below 70    On statin  . Lumbosacral disc disease Feb,1 2011   ThIs information from CT done several years ago  . Obstructive sleep apnea 07-19-2003   breathing disorder services,Dr. Steward Drone. 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  . ST elevation myocardial infarction (STEMI) involving right coronary artery in recovery phase ([place]) 05/30/1996   100% occluded RCA -- PTCA-POBA (~20% residual) of RCA (part of PAMI Trial) ;; Normal EF By Echo in 2014    Past Surgical History:  Procedure  Laterality Date  . CARDIAC CATHETERIZATION  12-23-1996   ASHD h/o acute DMI with emergency PTCA- randomized to balloon PTCA 05-30-1996 reperfusion time 2 hrs 2 min.,single vessel coronary disease RCA  . HERNIA REPAIR  2005   remote laparscopic hernia repair by Dr.martin in may 2005  . NM MYOVIEW LTD  05/2008   Mild anterior defect consistent with chest wall attenuation. Normal EF and no ischemia, no infarction  . TRANSTHORACIC ECHOCARDIOGRAM  January 2014   Mild concentric LVH. Normal systolic and diastolic function with EF 55-65%. Mild LA dilation. Aortic sclerosis without stenosis.    Current Meds  Medication Sig  . aspirin 81 MG tablet Take 81 mg by mouth daily.  Marland Kitchen linaclotide (LINZESS) 145 MCG CAPS capsule Take by mouth.  . metoprolol succinate (TOPROL-XL) 25 MG 24 hr tablet Take 0.5 tablets (12.5 mg total) by mouth daily.  . simvastatin (ZOCOR) 40 MG tablet Take 1 tablet (40 mg total) by mouth daily at 6 PM.  . vitamin E 400 UNIT capsule Take 400 Units by mouth daily.    No Known Allergies  Social History   Tobacco Use  . Smoking status: Former Smoker    Last attempt to quit: 12/02/1995    Years since quitting: 22.3  . Smokeless tobacco: Former Network engineer Use Topics  . Alcohol use: Yes    Comment: occasionally  . Drug use: Not on file   Social History   Social History Narrative   Married father of 2 with 3 children. He quit smoking in 1997.   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 He does drink not alcohol.    Family history is unknown by patient. -- He is not aware of details of his family history  Wt Readings from Last 3 Encounters:  03/31/18 189 lb 3.2 oz (85.8 kg)  03/31/17 188 lb 6.4 oz (85.5 kg)  03/31/16 191 lb 9.6 oz (86.9 kg)    PHYSICAL EXAM BP 132/62 (BP Location: Left Arm)   Pulse (!) 59   Ht 5\' 9"  (1.753 m)   Wt 189 lb 3.2 oz (85.8 kg)   BMI 27.94 kg/m   Physical Exam  Constitutional: He is oriented to person, place, and time. He appears  well-developed and well-nourished. No distress.  Healthy-appearing.  Well-groomed  HENT:  Head: Normocephalic and atraumatic.  Eyes: EOM are normal.  Neck: Normal range of motion. Neck supple. No hepatojugular reflux and no JVD present. Carotid bruit is not present.  Cardiovascular: Normal rate, regular rhythm, S1 normal, S2 normal, intact distal pulses and normal pulses.  No extrasystoles are present. PMI is not displaced. Exam reveals gallop and S4 (Cannot exclude). Exam reveals no friction rub.  Murmur heard.  Harsh crescendo-decrescendo early systolic murmur is present with a grade of 1/6 at the upper right sternal border radiating to the neck. Pulmonary/Chest: Effort normal and breath sounds normal. No respiratory distress. He has no wheezes. He has no rales. He exhibits no tenderness.  Abdominal: Soft. Bowel sounds are normal. He exhibits no distension. There is no tenderness. There is no rebound and no guarding.  Musculoskeletal: Normal range of motion.  He exhibits edema (Trivial).  Neurological: He is alert and oriented to person, place, and time.  Psychiatric: He has a normal mood and affect. His behavior is normal. Judgment and thought content normal.  Nursing note and vitals reviewed.    Adult ECG Report  Rate: 59 ;  Rhythm: sinus bradycardia and Otherwise normal axis, intervals and durations;   Narrative Interpretation: Normal EKG   Other studies Reviewed: Additional studies/ records that were reviewed today include:  Recent Labs: Last available labs from May 2018: Total cholesterol 139, LDL 69, TG 143, HDL 41.  BUN/creatinine 13/0.89.  A1c 5.9.   ASSESSMENT / PLAN: Problem List Items Addressed This Visit    Obstructive sleep apnea (Chronic)   Hyperlipidemia with target LDL less than 70 (Chronic)    Remains on simvastatin.  Last labs were in May 2018 and within goal.  We will recheck now.      Relevant Orders   Lipid panel (Completed)   Comprehensive metabolic panel  (Completed)   Hemoglobin A1c (Completed)   History of acute inferior wall MI (Chronic)    History of inferior STEMI with PTCA of occluded RCA. no obvious evidence of inferior infarct noted on Myoview, and the echocardiogram in 2014 which is most recent did not show any regional wall motion normalities.  Seems to have done well with PTCA only.      Relevant Orders   EKG 12-Lead (Completed)   Essential hypertension (Chronic)    Blood pressures well controlled today on low-dose Toprol.      Elevated blood sugar level    Chemistry evaluation in the past of shown some hyperglycemia.  We will check an A1c for diabetic screening.      Relevant Orders   Hemoglobin A1c (Completed)   CAD S/P percutaneous coronary angioplasty - Primary (Chronic)    Very distant history of PTCA only in 1997 of the RCA.  Remains on aspirin statin and beta-blocker.  No recurrent anginal symptoms.      Relevant Orders   Comprehensive metabolic panel (Completed)   Hemoglobin A1c (Completed)   EKG 12-Lead (Completed)   Aortic valve sclerosis (Chronic)    Relatively soft murmur on exam.  Would only check an echocardiogram if this were to worsen or symptoms would warrant.         I spent a total of 25 none minutes with the patient and chart review. >  50% of the time was spent in direct patient consultation.    Current medicines are reviewed at length with the patient today.  (+/- concerns) none The following changes have been made:  None   Patient Instructions  Medication instruction No changes      If symptoms becomes worse with sleeping pattern , contact to office - to restart process again.     Your physician wants you to follow-up in 12 months with Dr Ellyn Hack. You will receive a reminder letter in the mail two months in advance. If you don't receive a letter, please call our office to schedule the follow-up appointment.   If you need a refill on your cardiac medications before your next  appointment, please call your pharmacy.     Studies Ordered:   Orders Placed This Encounter  Procedures  . Lipid panel  . Comprehensive metabolic panel  . Hemoglobin A1c  . EKG 12-Lead      Glenetta Hew, M.D., M.S. Interventional Cardiologist   Pager # [phone] Phone # [phone] [address] [place],  [place] [address]   Thank you for choosing [facility] at [facility]!!

## 2018-03-31 NOTE — Patient Instructions (Signed)
Medication instruction No changes      If symptoms becomes worse with sleeping pattern , contact to office - to restart process again.     Your physician wants you to follow-up in 12 months with Dr Ellyn Hack. You will receive a reminder letter in the mail two months in advance. If you don't receive a letter, please call our office to schedule the follow-up appointment.   If you need a refill on your cardiac medications before your next appointment, please call your pharmacy.

## 2018-04-01 LAB — COMPREHENSIVE METABOLIC PANEL
ALK PHOS: 88 IU/L (ref 39–117)
ALT: 16 IU/L (ref 0–44)
AST: 18 IU/L (ref 0–40)
Albumin/Globulin Ratio: 2 (ref 1.2–2.2)
Albumin: 4.7 g/dL (ref 3.6–4.8)
BUN/Creatinine Ratio: 16 (ref 10–24)
BUN: 14 mg/dL (ref 8–27)
Bilirubin Total: 0.9 mg/dL (ref 0.0–1.2)
CHLORIDE: 103 mmol/L (ref 96–106)
CO2: 26 mmol/L (ref 20–29)
Calcium: 9.8 mg/dL (ref 8.6–10.2)
Creatinine, Ser: 0.87 mg/dL (ref 0.76–1.27)
GFR calc Af Amer: 105 mL/min/{1.73_m2} (ref >59)
GFR calc non Af Amer: 91 mL/min/{1.73_m2} (ref >59)
GLUCOSE: 101 mg/dL — AB (ref 65–99)
Globulin, Total: 2.4 g/dL (ref 1.5–4.5)
Potassium: 5.1 mmol/L (ref 3.5–5.2)
Sodium: 142 mmol/L (ref 134–144)
Total Protein: 7.1 g/dL (ref 6.0–8.5)

## 2018-04-01 LAB — LIPID PANEL
CHOL/HDL RATIO: 3.6 ratio (ref 0.0–5.0)
Cholesterol, Total: 164 mg/dL (ref 100–199)
HDL: 45 mg/dL (ref >39)
LDL CALC: 91 mg/dL (ref 0–99)
Triglycerides: 139 mg/dL (ref 0–149)
VLDL CHOLESTEROL CAL: 28 mg/dL (ref 5–40)

## 2018-04-01 LAB — HEMOGLOBIN A1C
ESTIMATED AVERAGE GLUCOSE: 117 mg/dL
HEMOGLOBIN A1C: 5.7 % — AB (ref 4.8–5.6)

## 2018-04-02 ENCOUNTER — Encounter: Payer: Self-pay | Admitting: Cardiology

## 2018-04-02 DIAGNOSIS — R739 Hyperglycemia, unspecified: Secondary | ICD-10-CM | POA: Insufficient documentation

## 2018-04-02 NOTE — Assessment & Plan Note (Signed)
Remains on simvastatin.  Last labs were in May 2018 and within goal.  We will recheck now.

## 2018-04-02 NOTE — Assessment & Plan Note (Signed)
Very distant history of PTCA only in 1997 of the RCA.  Remains on aspirin statin and beta-blocker.  No recurrent anginal symptoms.

## 2018-04-02 NOTE — Assessment & Plan Note (Signed)
Blood pressures well controlled today on low-dose Toprol.

## 2018-04-02 NOTE — Assessment & Plan Note (Signed)
Chemistry evaluation in the past of shown some hyperglycemia.  We will check an A1c for diabetic screening.

## 2018-04-02 NOTE — Assessment & Plan Note (Signed)
Relatively soft murmur on exam.  Would only check an echocardiogram if this were to worsen or symptoms would warrant.

## 2018-04-02 NOTE — Assessment & Plan Note (Signed)
History of inferior STEMI with PTCA of occluded RCA. no obvious evidence of inferior infarct noted on Myoview, and the echocardiogram in 2014 which is most recent did not show any regional wall motion normalities.  Seems to have done well with PTCA only.

## 2018-04-26 ENCOUNTER — Other Ambulatory Visit: Payer: Self-pay | Admitting: Cardiology

## 2018-04-27 NOTE — Telephone Encounter (Signed)
Rx sent to pharmacy   

## 2018-07-12 ENCOUNTER — Other Ambulatory Visit (HOSPITAL_COMMUNITY): Payer: Self-pay | Admitting: Physician Assistant

## 2018-07-12 ENCOUNTER — Ambulatory Visit (HOSPITAL_COMMUNITY)
Admission: RE | Admit: 2018-07-12 | Discharge: 2018-07-12 | Disposition: A | Discharge status: Home / Self Care | Payer: BLUE CROSS/BLUE SHIELD | Source: Ambulatory Visit | Attending: Physician Assistant | Admitting: Physician Assistant

## 2018-07-12 DIAGNOSIS — M5440 Lumbago with sciatica, unspecified side: Secondary | ICD-10-CM | POA: Insufficient documentation

## 2018-07-12 DIAGNOSIS — M47816 Spondylosis without myelopathy or radiculopathy, lumbar region: Secondary | ICD-10-CM | POA: Insufficient documentation

## 2018-07-12 DIAGNOSIS — M4856XA Collapsed vertebra, not elsewhere classified, lumbar region, initial encounter for fracture: Secondary | ICD-10-CM | POA: Insufficient documentation

## 2018-07-14 ENCOUNTER — Ambulatory Visit (INDEPENDENT_AMBULATORY_CARE_PROVIDER_SITE_OTHER): Payer: BLUE CROSS/BLUE SHIELD | Admitting: Orthopedic Surgery

## 2018-07-14 ENCOUNTER — Encounter (INDEPENDENT_AMBULATORY_CARE_PROVIDER_SITE_OTHER): Payer: Self-pay | Admitting: Orthopedic Surgery

## 2018-07-14 DIAGNOSIS — S32010A Wedge compression fracture of first lumbar vertebra, initial encounter for closed fracture: Secondary | ICD-10-CM | POA: Diagnosis not present

## 2018-07-18 ENCOUNTER — Encounter (INDEPENDENT_AMBULATORY_CARE_PROVIDER_SITE_OTHER): Payer: Self-pay | Admitting: Orthopedic Surgery

## 2018-07-18 NOTE — Progress Notes (Signed)
Office Visit Note   Patient: Omar [name]           Date of Birth: 02/20/1956           MRN: [id number] Visit Date: 07/14/2018 Requested by: Harlene Salts, PA-C [place], [place] [address] PCP: Harlene Salts, PA-C  Subjective: Chief Complaint  Patient presents with  . Lower Back - Pain    HPI: Omar [name] is a patient with back pain.  He does have a history of back problems but he sustained an injury 07/05/2018.  He was having some cramps in his legs and he stood up and then fell directly onto his sacrum.  This was a significant injury.  Was given a prescription for Flexeril prednisone Norco.  He reports continued pain.  He had injections with Dr. Ernestina Patches last year.  Relief is really only possible when he lays flat on his back.  He denies any radiating leg symptoms.              ROS: All systems reviewed are negative as they relate to the chief complaint within the history of present illness.  Patient denies  fevers or chills.   Assessment & Plan: Visit Diagnoses:  1. Closed compression fracture of first lumbar vertebra, initial encounter [facility])     Plan: Impression is lumbar compression fracture L1 with 50% compression.  Not too much to do at this time.  I am going to check him with a lumbar corset and see him back in mid-September.  I do not want him to do any lifting in the meantime.  Follow-Up Instructions: Return in about 4 weeks (around 08/11/2018).   Orders:  No orders of the defined types were placed in this encounter.  No orders of the defined types were placed in this encounter.     Procedures: No procedures performed   Clinical Data: No additional findings.  Objective: Vital Signs: There were no vitals taken for this visit.  Physical Exam:   Constitutional: Patient appears well-developed HEENT:  Head: Normocephalic Eyes:EOM are normal Neck: Normal range of motion Cardiovascular: Normal rate Pulmonary/chest: Effort normal Neurologic:  Patient is alert Skin: Skin is warm Psychiatric: Patient has normal mood and affect    Ortho Exam: Ortho exam demonstrates full active and passive range of motion of knees hips and ankles.  No definite nerve root tension signs but does have pain with forward and lateral bending.  Patient has 5 out of 5 ankle dorsiflexion plantarflexion quad hamstring strength.  No other masses lymph adenopathy or skin changes noted in the back region.  Specialty Comments:  No specialty comments available.  Imaging: No results found.   PMFS History: Patient Active Problem List   Diagnosis Date Noted  . Elevated blood sugar level 04/02/2018  . Constipation by delayed colonic transit 03/31/2017  . Essential hypertension 03/31/2017  . Impaired fasting glucose 04/02/2016  . Obesity (BMI 30-39.9) 08/31/2014  . Hyperlipidemia with target LDL less than 70   . Aortic valve sclerosis 12/01/2012  . Obstructive sleep apnea 07/19/2003  . History of acute inferior wall MI 05/30/1996  . CAD S/P percutaneous coronary angioplasty 05/30/1996   Past Medical History:  Diagnosis Date  . Anxiety 07-19-2003   Dr.Saaat Humphrey Rolls  . Aortic valve sclerosis January 2014   Noted on echocardiogram to have aortic sclerosis without stenosis. Ordered for murmur  . CAD S/P percutaneous coronary angioplasty 05/30/1996   single -vessel disease (RCA) Rx with POBA on PAMI protocol  in june 1997; Re-Look Cath 12/1996: ~20-30% residual no stenosis in 1998 with good LV Function; Myoview 6/'09:  low risk scan ,post EF 58%, this was the fourth such scan in as many years  . Chronic low back pain    Has seen Dr. Marlou Sa  . Dyslipidemia, goal LDL below 70    On statin  . Lumbosacral disc disease Feb,1 2011   ThIs information from CT done several years ago  . Obstructive sleep apnea 07-19-2003   breathing disorder services,Dr. Steward Drone. 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  . ST elevation myocardial infarction (STEMI) involving right coronary artery in recovery phase ([place]) 05/30/1996   100% occluded RCA -- PTCA-POBA (~20% residual) of RCA (part of PAMI Trial) ;; Normal EF By Echo in 2014    Family History  Family history unknown: Yes    Past Surgical History:  Procedure Laterality Date  . CARDIAC CATHETERIZATION  12-23-1996   ASHD h/o acute DMI with emergency PTCA- randomized to balloon PTCA 05-30-1996 reperfusion time 2 hrs 2 min.,single vessel coronary disease RCA  . HERNIA REPAIR  2005   remote laparscopic hernia repair by Dr.martin in may 2005  . NM MYOVIEW LTD  05/2008   Mild anterior defect consistent with chest wall attenuation. Normal EF and no ischemia, no infarction  . TRANSTHORACIC ECHOCARDIOGRAM  January 2014   Mild concentric LVH. Normal systolic and diastolic function with EF 55-65%. Mild LA dilation. Aortic sclerosis without stenosis.   Social History   Occupational History  . Not on file  Tobacco Use  . Smoking status: Former Smoker    Last attempt to quit: 12/02/1995    Years since quitting: 22.6  . Smokeless tobacco: Former Network engineer and Sexual Activity  . Alcohol use: Yes    Comment: occasionally  . Drug use: Not on file  . Sexual activity: Not on file

## 2018-07-20 ENCOUNTER — Encounter (INDEPENDENT_AMBULATORY_CARE_PROVIDER_SITE_OTHER): Payer: Self-pay | Admitting: *Deleted

## 2018-07-21 ENCOUNTER — Telehealth (INDEPENDENT_AMBULATORY_CARE_PROVIDER_SITE_OTHER): Payer: Self-pay | Admitting: Orthopedic Surgery

## 2018-07-21 NOTE — Telephone Encounter (Signed)
OV Note faxed to [place]

## 2018-07-26 ENCOUNTER — Encounter (INDEPENDENT_AMBULATORY_CARE_PROVIDER_SITE_OTHER): Payer: Self-pay | Admitting: *Deleted

## 2018-07-28 ENCOUNTER — Other Ambulatory Visit (INDEPENDENT_AMBULATORY_CARE_PROVIDER_SITE_OTHER): Payer: Self-pay | Admitting: *Deleted

## 2018-07-28 DIAGNOSIS — Z1211 Encounter for screening for malignant neoplasm of colon: Secondary | ICD-10-CM | POA: Insufficient documentation

## 2018-08-11 ENCOUNTER — Ambulatory Visit (INDEPENDENT_AMBULATORY_CARE_PROVIDER_SITE_OTHER): Payer: BLUE CROSS/BLUE SHIELD | Admitting: Orthopedic Surgery

## 2018-08-11 ENCOUNTER — Encounter (INDEPENDENT_AMBULATORY_CARE_PROVIDER_SITE_OTHER): Payer: Self-pay | Admitting: Orthopedic Surgery

## 2018-08-11 ENCOUNTER — Ambulatory Visit (INDEPENDENT_AMBULATORY_CARE_PROVIDER_SITE_OTHER): Payer: BLUE CROSS/BLUE SHIELD

## 2018-08-11 DIAGNOSIS — S32010A Wedge compression fracture of first lumbar vertebra, initial encounter for closed fracture: Secondary | ICD-10-CM | POA: Diagnosis not present

## 2018-08-11 NOTE — Progress Notes (Signed)
Post-Op Visit Note   Patient: Omar [name]           Date of Birth: Sep 18, 1956           MRN: [id number] Visit Date: 08/11/2018 PCP: Bridget Hartshorn, NP   Assessment & Plan:  Chief Complaint:  Chief Complaint  Patient presents with  . Lower Back - Follow-up   Visit Diagnoses:  1. Closed compression fracture of first lumbar vertebra, initial encounter [facility])     Plan: Omar [name] presents for follow-up of L1 compression fracture.  He is better.  He is having a lot of pain down in that left SI joint region.  Having less pain around the compression fracture itself.  He is 5 weeks out from injury.  Last epidural steroid injection in January.  No nerve retention signs today but he is having what looks like facet mediated pain when he stands from the seated position.  Plan at this time is to refer to Dr. Ernestina Patches for L-spine ESI.  I will see him back as needed.  I think he should wait at least 3 weeks before playing golf and a significantly vigorous manner  Follow-Up Instructions: No follow-ups on file.   Orders:  Orders Placed This Encounter  Procedures  . XR Lumbar Spine 2-3 Views  . Ambulatory referral to Physical Medicine Rehab   No orders of the defined types were placed in this encounter.   Imaging: Xr Lumbar Spine 2-3 Views  Result Date: 08/11/2018 AP lateral lumbar spine reviewed.  Again noted is L1 compression fracture which is not increased in severity.  Remainder of the lumbar spine examination unchanged from prior studies 1 month ago   PMFS History: Patient Active Problem List   Diagnosis Date Noted  . Special screening for malignant neoplasms, colon 07/28/2018  . Elevated blood sugar level 04/02/2018  . Constipation by delayed colonic transit 03/31/2017  . Essential hypertension 03/31/2017  . Impaired fasting glucose 04/02/2016  . Obesity (BMI 30-39.9) 08/31/2014  . Hyperlipidemia with target LDL less than 70   . Aortic valve sclerosis 12/01/2012  .  Obstructive sleep apnea 07/19/2003  . History of acute inferior wall MI 05/30/1996  . CAD S/P percutaneous coronary angioplasty 05/30/1996   Past Medical History:  Diagnosis Date  . Anxiety 07-19-2003   Dr.Saaat Humphrey Rolls  . Aortic valve sclerosis January 2014   Noted on echocardiogram to have aortic sclerosis without stenosis. Ordered for murmur  . CAD S/P percutaneous coronary angioplasty 05/30/1996   single -vessel disease (RCA) Rx with POBA on PAMI protocol in june 1997; Re-Look Cath 12/1996: ~20-30% residual no stenosis in 1998 with good LV Function; Myoview 6/'09:  low risk scan ,post EF 58%, this was the fourth such scan in as many years  . Chronic low back pain    Has seen Dr. Marlou Sa  . Dyslipidemia, goal LDL below 70    On statin  . Lumbosacral disc disease Feb,1 2011   ThIs information from CT done several years ago  . Obstructive sleep apnea 07-19-2003   breathing disorder services,Dr. Steward Drone. 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  . ST elevation myocardial infarction (STEMI) involving right coronary artery in recovery phase ([place]) 05/30/1996   100% occluded RCA -- PTCA-POBA (~20% residual) of RCA (part of PAMI Trial) ;; Normal EF By Echo in 2014    Family History  Family history unknown: Yes  Past Surgical History:  Procedure Laterality Date  . CARDIAC CATHETERIZATION  12-23-1996   ASHD h/o acute DMI with emergency PTCA- randomized to balloon PTCA 05-30-1996 reperfusion time 2 hrs 2 min.,single vessel coronary disease RCA  . HERNIA REPAIR  2005   remote laparscopic hernia repair by Dr.martin in may 2005  . NM MYOVIEW LTD  05/2008   Mild anterior defect consistent with chest wall attenuation. Normal EF and no ischemia, no infarction  . TRANSTHORACIC ECHOCARDIOGRAM  January 2014   Mild concentric LVH. Normal systolic and diastolic function with EF 55-65%. Mild LA dilation. Aortic sclerosis without  stenosis.   Social History   Occupational History  . Not on file  Tobacco Use  . Smoking status: Former Smoker    Last attempt to quit: 12/02/1995    Years since quitting: 22.7  . Smokeless tobacco: Former Network engineer and Sexual Activity  . Alcohol use: Yes    Comment: occasionally  . Drug use: Not on file  . Sexual activity: Not on file

## 2018-08-13 ENCOUNTER — Telehealth (INDEPENDENT_AMBULATORY_CARE_PROVIDER_SITE_OTHER): Payer: Self-pay | Admitting: Radiology

## 2018-08-13 NOTE — Telephone Encounter (Signed)
Patient lmom trying to return call about scheduling, states trouble with "phone tag"  He requesting time for Monday or Tuesday next week in the morning (08/23/18 or 08/24/18). I lmom that his message was received, Dr. Kennon Portela assistants were not in office this afternoon but unfortunately days requested were already booked, they will return his call on Monday with a detailed message with next available appointment date and time per patient request.  cb # [phone]

## 2018-08-16 NOTE — Telephone Encounter (Signed)
Called back and left another message. Asked patient to let me know what times are best to get in touch with him.

## 2018-08-26 ENCOUNTER — Ambulatory Visit (INDEPENDENT_AMBULATORY_CARE_PROVIDER_SITE_OTHER): Payer: Self-pay

## 2018-08-26 ENCOUNTER — Encounter (INDEPENDENT_AMBULATORY_CARE_PROVIDER_SITE_OTHER): Payer: Self-pay | Admitting: Physical Medicine and Rehabilitation

## 2018-08-26 ENCOUNTER — Ambulatory Visit (INDEPENDENT_AMBULATORY_CARE_PROVIDER_SITE_OTHER): Payer: BLUE CROSS/BLUE SHIELD | Admitting: Physical Medicine and Rehabilitation

## 2018-08-26 VITALS — BP 139/87 | HR 68 | Temp 98.2°F

## 2018-08-26 DIAGNOSIS — M5416 Radiculopathy, lumbar region: Secondary | ICD-10-CM | POA: Diagnosis not present

## 2018-08-26 MED ORDER — BETAMETHASONE SOD PHOS & ACET 6 (3-3) MG/ML IJ SUSP
12.0000 mg | Freq: Once | INTRAMUSCULAR | Status: AC
  Administered 2018-08-26: 12 mg

## 2018-08-26 NOTE — Progress Notes (Signed)
 .  Numeric Pain Rating Scale and Functional Assessment Average Pain 8   In the last MONTH (on 0-10 scale) has pain interfered with the following?  1. General activity like being  able to carry out your everyday physical activities such as walking, climbing stairs, carrying groceries, or moving a chair?  Rating(4)   +Driver, -BT, -Dye Allergies.  

## 2018-08-26 NOTE — Patient Instructions (Signed)

## 2018-09-01 ENCOUNTER — Telehealth (INDEPENDENT_AMBULATORY_CARE_PROVIDER_SITE_OTHER): Payer: Self-pay | Admitting: *Deleted

## 2018-09-01 ENCOUNTER — Encounter (INDEPENDENT_AMBULATORY_CARE_PROVIDER_SITE_OTHER): Payer: Self-pay | Admitting: *Deleted

## 2018-09-01 MED ORDER — SUPREP BOWEL PREP KIT 17.5-3.13-1.6 GM/177ML PO SOLN: 1.0000 | Freq: Once | ORAL | 0 refills | Status: AC

## 2018-09-01 NOTE — Telephone Encounter (Signed)
Patient needs suprep 

## 2018-09-03 NOTE — Procedures (Signed)
Lumbosacral Transforaminal Epidural Steroid Injection - Sub-Pedicular Approach with Fluoroscopic Guidance  Patient: Omar [name]      Date of Birth: [birth year] MRN: [id number] PCP: Bridget Hartshorn, NP      Visit Date: 08/26/2018   Universal Protocol:    Date/Time: 08/26/2018  Consent Given By: the patient  Position: PRONE  Additional Comments: Vital signs were monitored before and after the procedure. Patient was prepped and draped in the usual sterile fashion. The correct patient, procedure, and site was verified.   Injection Procedure Details:  Procedure Site One Meds Administered:  Meds ordered this encounter  Medications  . betamethasone acetate-betamethasone sodium phosphate (CELESTONE) injection 12 mg    Laterality: Right  Location/Site:  L4-L5  Needle size: 22 G  Needle type: Spinal  Needle Placement: Transforaminal  Findings:    -Comments: Excellent flow of contrast along the nerve and into the epidural space.  Procedure Details: After squaring off the end-plates to get a true AP view, the C-arm was positioned so that an oblique view of the foramen as noted above was visualized. The target area is just inferior to the "nose of the scotty dog" or sub pedicular. The soft tissues overlying this structure were infiltrated with 2-3 ml. of 1% Lidocaine without Epinephrine.  The spinal needle was inserted toward the target using a "trajectory" view along the fluoroscope beam.  Under AP and lateral visualization, the needle was advanced so it did not puncture dura and was located close the 6 O'Clock position of the pedical in AP tracterory. Biplanar projections were used to confirm position. Aspiration was confirmed to be negative for CSF and/or blood. A 1-2 ml. volume of Isovue-250 was injected and flow of contrast was noted at each level. Radiographs were obtained for documentation purposes.   After attaining the desired flow of contrast documented above, a  0.5 to 1.0 ml test dose of 0.25% Marcaine was injected into each respective transforaminal space.  The patient was observed for 90 seconds post injection.  After no sensory deficits were reported, and normal lower extremity motor function was noted,   the above injectate was administered so that equal amounts of the injectate were placed at each foramen (level) into the transforaminal epidural space.   Additional Comments:  The patient tolerated the procedure well Dressing: Band-Aid    Post-procedure details: Patient was observed during the procedure. Post-procedure instructions were reviewed.  Patient left the clinic in stable condition.

## 2018-09-03 NOTE — Progress Notes (Signed)
Omar [name] - 62 y.o. male MRN [id number]  Date of birth: Jan 08, 1956  Office Visit Note: Visit Date: 08/26/2018 PCP: Bridget Hartshorn, NP Referred by: Bridget Hartshorn, NP  Subjective: Chief Complaint  Patient presents with  . Lower Back - Pain  . Right Hip - Pain   HPI:  Omar [name] is a 62 y.o. male who comes in today at the request of Dr. Anderson Malta for lumbar epidural injection.  Prior injection was for a left-sided disc protrusion L3-4 the patient did extremely well.  Since we have seen him he has had a fall and L1 compression fracture seems to be doing okay with that but continues now to have actually right-sided symptoms more than left.  Symptoms of the posterior lateral hip.  Prior MRI findings do show lateral recess narrowing at L4-5.  We will complete a diagnostic and hopefully therapeutic right L4 transforaminal injection.  ROS Otherwise per HPI.  Assessment & Plan: Visit Diagnoses:  1. Lumbar radiculopathy     Plan: No additional findings.   Meds & Orders:  Meds ordered this encounter  Medications  . betamethasone acetate-betamethasone sodium phosphate (CELESTONE) injection 12 mg    Orders Placed This Encounter  Procedures  . XR C-ARM NO REPORT  . Epidural Steroid injection    Follow-up: Return if symptoms worsen or fail to improve.   Procedures: No procedures performed  Lumbosacral Transforaminal Epidural Steroid Injection - Sub-Pedicular Approach with Fluoroscopic Guidance  Patient: Omar [name]      Date of Birth: 1956-10-21 MRN: [id number] PCP: Bridget Hartshorn, NP      Visit Date: 08/26/2018   Universal Protocol:    Date/Time: 08/26/2018  Consent Given By: the patient  Position: PRONE  Additional Comments: Vital signs were monitored before and after the procedure. Patient was prepped and draped in the usual sterile fashion. The correct patient, procedure, and site was verified.   Injection Procedure  Details:  Procedure Site One Meds Administered:  Meds ordered this encounter  Medications  . betamethasone acetate-betamethasone sodium phosphate (CELESTONE) injection 12 mg    Laterality: Right  Location/Site:  L4-L5  Needle size: 22 G  Needle type: Spinal  Needle Placement: Transforaminal  Findings:    -Comments: Excellent flow of contrast along the nerve and into the epidural space.  Procedure Details: After squaring off the end-plates to get a true AP view, the C-arm was positioned so that an oblique view of the foramen as noted above was visualized. The target area is just inferior to the "nose of the scotty dog" or sub pedicular. The soft tissues overlying this structure were infiltrated with 2-3 ml. of 1% Lidocaine without Epinephrine.  The spinal needle was inserted toward the target using a "trajectory" view along the fluoroscope beam.  Under AP and lateral visualization, the needle was advanced so it did not puncture dura and was located close the 6 O'Clock position of the pedical in AP tracterory. Biplanar projections were used to confirm position. Aspiration was confirmed to be negative for CSF and/or blood. A 1-2 ml. volume of Isovue-250 was injected and flow of contrast was noted at each level. Radiographs were obtained for documentation purposes.   After attaining the desired flow of contrast documented above, a 0.5 to 1.0 ml test dose of 0.25% Marcaine was injected into each respective transforaminal space.  The patient was observed for 90 seconds post injection.  After no sensory deficits were reported, and normal lower extremity  motor function was noted,   the above injectate was administered so that equal amounts of the injectate were placed at each foramen (level) into the transforaminal epidural space.   Additional Comments:  The patient tolerated the procedure well Dressing: Band-Aid    Post-procedure details: Patient was observed during the  procedure. Post-procedure instructions were reviewed.  Patient left the clinic in stable condition.    Clinical History: Lspine MRI 07/17/2016 IMPRESSION: Left paracentral disc protrusion at L2-3 with encroachment on the traversing left L3 nerve root.  Multilevel degenerative disc disease and facet arthrosis. Moderate central canal and bilateral subarticular recess stenosis at L4-5  Mild bilateral foraminal stenosis L4-5 and L5-S1  Moderately severe right lateral recess and foraminal stenosis at L3-4  Mild levoconvex scoliosis     Objective:  VS:  HT:    WT:   BMI:     BP:139/87  HR:68bpm  TEMP:98.2 F (36.8 C)(Oral)  RESP:  Physical Exam  Ortho Exam Imaging: No results found.

## 2018-09-27 ENCOUNTER — Telehealth (INDEPENDENT_AMBULATORY_CARE_PROVIDER_SITE_OTHER): Payer: Self-pay | Admitting: *Deleted

## 2018-09-27 NOTE — Telephone Encounter (Signed)
Referring MD/PCP: Lars Mage, np   Procedure: tcs  Reason/Indication:  screening  Has patient had this procedure before?  no  If so, when, by whom and where?    Is there a family history of colon cancer?  no  Who?  What age when diagnosed?    Is patient diabetic?   no      Does patient have prosthetic heart valve or mechanical valve?  no  Do you have a pacemaker?  no  Has patient ever had endocarditis? no  Has patient had joint replacement within last 12 months?  no  Is patient constipated or do they take laxatives? no  Does patient have a history of alcohol/drug use?  no  Is patient on blood thinner such as Coumadin, Plavix and/or Aspirin? yes  Medications: asa 81 mg daily, simvastatin 40 mg daily, metoprolol 25 mg 1/2 tab daily, vit e 400 mg daily  Allergies: nkda  Medication Adjustment per Dr Lindi Adie, NP: asa 2 days, vit e 10 days  Procedure date & time: 10/27/18 at 1030

## 2018-09-27 NOTE — Telephone Encounter (Signed)
agree

## 2018-10-27 ENCOUNTER — Other Ambulatory Visit: Payer: Self-pay

## 2018-10-27 ENCOUNTER — Encounter (HOSPITAL_COMMUNITY): Payer: Self-pay | Admitting: *Deleted

## 2018-10-27 ENCOUNTER — Ambulatory Visit (HOSPITAL_COMMUNITY)
Admission: RE | Admit: 2018-10-27 | Discharge: 2018-10-27 | Disposition: A | Discharge status: Home / Self Care | Payer: BLUE CROSS/BLUE SHIELD | Source: Ambulatory Visit | Attending: Internal Medicine | Admitting: Internal Medicine

## 2018-10-27 ENCOUNTER — Encounter (HOSPITAL_COMMUNITY)
Admission: RE | Disposition: A | Discharge status: Home / Self Care | Payer: Self-pay | Source: Ambulatory Visit | Attending: Internal Medicine

## 2018-10-27 DIAGNOSIS — K644 Residual hemorrhoidal skin tags: Secondary | ICD-10-CM | POA: Diagnosis not present

## 2018-10-27 DIAGNOSIS — Z7982 Long term (current) use of aspirin: Secondary | ICD-10-CM | POA: Diagnosis not present

## 2018-10-27 DIAGNOSIS — Z79899 Other long term (current) drug therapy: Secondary | ICD-10-CM | POA: Diagnosis not present

## 2018-10-27 DIAGNOSIS — Z87891 Personal history of nicotine dependence: Secondary | ICD-10-CM | POA: Insufficient documentation

## 2018-10-27 DIAGNOSIS — Z1211 Encounter for screening for malignant neoplasm of colon: Secondary | ICD-10-CM | POA: Insufficient documentation

## 2018-10-27 DIAGNOSIS — K5730 Diverticulosis of large intestine without perforation or abscess without bleeding: Secondary | ICD-10-CM | POA: Insufficient documentation

## 2018-10-27 DIAGNOSIS — I10 Essential (primary) hypertension: Secondary | ICD-10-CM | POA: Diagnosis not present

## 2018-10-27 DIAGNOSIS — G4733 Obstructive sleep apnea (adult) (pediatric): Secondary | ICD-10-CM | POA: Insufficient documentation

## 2018-10-27 DIAGNOSIS — D120 Benign neoplasm of cecum: Secondary | ICD-10-CM | POA: Diagnosis not present

## 2018-10-27 DIAGNOSIS — I252 Old myocardial infarction: Secondary | ICD-10-CM | POA: Diagnosis not present

## 2018-10-27 HISTORY — DX: Essential (primary) hypertension: I10

## 2018-10-27 HISTORY — PX: POLYPECTOMY: SHX5525

## 2018-10-27 HISTORY — PX: COLONOSCOPY: SHX5424

## 2018-10-27 SURGERY — COLONOSCOPY
Anesthesia: Moderate Sedation

## 2018-10-27 MED ORDER — MEPERIDINE HCL 50 MG/ML IJ SOLN
INTRAMUSCULAR | Status: AC
  Filled 2018-10-27: qty 1

## 2018-10-27 MED ORDER — MIDAZOLAM HCL 5 MG/5ML IJ SOLN
INTRAMUSCULAR | Status: AC
  Filled 2018-10-27: qty 10

## 2018-10-27 MED ORDER — SODIUM CHLORIDE 0.9 % IV SOLN
INTRAVENOUS | Status: DC
  Administered 2018-10-27: 10:00:00 via INTRAVENOUS

## 2018-10-27 MED ORDER — STERILE WATER FOR IRRIGATION IR SOLN
Status: DC | PRN
  Administered 2018-10-27: 11:00:00

## 2018-10-27 MED ORDER — MEPERIDINE HCL 50 MG/ML IJ SOLN
INTRAMUSCULAR | Status: DC | PRN
  Administered 2018-10-27 (×2): 25 mg via INTRAVENOUS

## 2018-10-27 MED ORDER — MIDAZOLAM HCL 5 MG/5ML IJ SOLN
INTRAMUSCULAR | Status: DC | PRN
  Administered 2018-10-27: 1 mg via INTRAVENOUS
  Administered 2018-10-27: 3 mg via INTRAVENOUS

## 2018-10-27 NOTE — Discharge Instructions (Signed)
Colon Polyps Polyps are tissue growths inside the body. Polyps can grow in many places, including the large intestine (colon). A polyp may be a round bump or a mushroom-shaped growth. You could have one polyp or several. Most colon polyps are noncancerous (benign). However, some colon polyps can become cancerous over time. What are the causes? The exact cause of colon polyps is not known. What increases the risk? This condition is more likely to develop in people who:  Have a family history of colon cancer or colon polyps.  Are older than 84 or older than 45 if they are African American.  Have inflammatory bowel disease, such as ulcerative colitis or Crohn disease.  Are overweight.  Smoke cigarettes.  Do not get enough exercise.  Drink too much alcohol.  Eat a diet that is: ? High in fat and red meat. ? Low in fiber.  Had childhood cancer that was treated with abdominal radiation.  What are the signs or symptoms? Most polyps do not cause symptoms. If you have symptoms, they may include:  Blood coming from your rectum when having a bowel movement.  Blood in your stool.The stool may look dark red or black.  A change in bowel habits, such as constipation or diarrhea.  How is this diagnosed? This condition is diagnosed with a colonoscopy. This is a procedure that uses a lighted, flexible scope to look at the inside of your colon. How is this treated? Treatment for this condition involves removing any polyps that are found. Those polyps will then be tested for cancer. If cancer is found, your health care provider will talk to you about options for colon cancer treatment. Follow these instructions at home: Diet  Eat plenty of fiber, such as fruits, vegetables, and whole grains.  Eat foods that are high in calcium and vitamin D, such as milk, cheese, yogurt, eggs, liver, fish, and broccoli.  Limit foods high in fat, red meats, and processed meats, such as hot dogs,  sausage, bacon, and lunch meats.  Maintain a healthy weight, or lose weight if recommended by your health care provider. General instructions  Do not smoke cigarettes.  Do not drink alcohol excessively.  Keep all follow-up visits as told by your health care provider. This is important. This includes keeping regularly scheduled colonoscopies. Talk to your health care provider about when you need a colonoscopy.  Exercise every day or as told by your health care provider. Contact a health care provider if:  You have new or worsening bleeding during a bowel movement.  You have new or increased blood in your stool.  You have a change in bowel habits.  You unexpectedly lose weight. This information is not intended to replace advice given to you by your health care provider. Make sure you discuss any questions you have with your health care provider. Document Released: 08/13/2004 Document Revised: 04/24/2016 Document Reviewed: 10/08/2015 Elsevier Interactive Patient Education  2018 [place].  High-Fiber Diet Fiber, also called dietary fiber, is a type of carbohydrate found in fruits, vegetables, whole grains, and beans. A high-fiber diet can have many health benefits. Your health care provider may recommend a high-fiber diet to help:  Prevent constipation. Fiber can make your bowel movements more regular.  Lower your cholesterol.  Relieve hemorrhoids, uncomplicated diverticulosis, or irritable bowel syndrome.  Prevent overeating as part of a weight-loss plan.  Prevent heart disease, type 2 diabetes, and certain cancers.  What is my plan? The recommended daily intake of fiber  includes:  38 grams for men under age 71.  58 grams for men over age 37.  88 grams for women under age 45.  45 grams for women over age 77.  You can get the recommended daily intake of dietary fiber by eating a variety of fruits, vegetables, grains, and beans. Your health care provider may also  recommend a fiber supplement if it is not possible to get enough fiber through your diet. What do I need to know about a high-fiber diet?  Fiber supplements have not been widely studied for their effectiveness, so it is better to get fiber through food sources.  Always check the fiber content on thenutrition facts label of any prepackaged food. Look for foods that contain at least 5 grams of fiber per serving.  Ask your dietitian if you have questions about specific foods that are related to your condition, especially if those foods are not listed in the following section.  Increase your daily fiber consumption gradually. Increasing your intake of dietary fiber too quickly may cause bloating, cramping, or gas.  Drink plenty of water. Water helps you to digest fiber. What foods can I eat? Grains Whole-grain breads. Multigrain cereal. Oats and oatmeal. Brown rice. Barley. Bulgur wheat. [place]. Bran muffins. Popcorn. Rye wafer crackers. Vegetables Sweet potatoes. Spinach. Kale. Artichokes. Cabbage. Broccoli. Green peas. Carrots. Squash. Fruits Berries. Pears. Apples. Oranges. Avocados. Prunes and raisins. Dried figs. Meats and Other Protein Sources [place], kidney, pinto, and soy beans. Split peas. Lentils. Nuts and seeds. Dairy Fiber-fortified yogurt. Beverages Fiber-fortified soy milk. Fiber-fortified orange juice. Other Fiber bars. The items listed above may not be a complete list of recommended foods or beverages. Contact your dietitian for more options. What foods are not recommended? Grains White bread. Pasta made with refined flour. White rice. Vegetables Fried potatoes. Canned vegetables. Well-cooked vegetables. Fruits Fruit juice. Cooked, strained fruit. Meats and Other Protein Sources Fatty cuts of meat. Fried Sales executive or fried fish. Dairy Milk. Yogurt. Cream cheese. Sour cream. Beverages Soft drinks. Other Cakes and pastries. Butter and oils. The items listed above may  not be a complete list of foods and beverages to avoid. Contact your dietitian for more information. What are some tips for including high-fiber foods in my diet?  Eat a wide variety of high-fiber foods.  Make sure that half of all grains consumed each day are whole grains.  Replace breads and cereals made from refined flour or white flour with whole-grain breads and cereals.  Replace white rice with brown rice, bulgur wheat, or millet.  Start the day with a breakfast that is high in fiber, such as a cereal that contains at least 5 grams of fiber per serving.  Use beans in place of meat in soups, salads, or pasta.  Eat high-fiber snacks, such as berries, raw vegetables, nuts, or popcorn. This information is not intended to replace advice given to you by your health care provider. Make sure you discuss any questions you have with your health care provider. Document Released: 11/17/2005 Document Revised: 04/24/2016 Document Reviewed: 05/02/2014 Elsevier Interactive Patient Education  2018 [employer].   Colonoscopy, Adult, Care After This sheet gives you information about how to care for yourself after your procedure. Your doctor may also give you more specific instructions. If you have problems or questions, call your doctor. Follow these instructions at home: General instructions   For the first 24 hours after the procedure: ? Do not drive or use machinery. ? Do not sign important  documents. ? Do not drink alcohol. ? Do your daily activities more slowly than normal. ? Eat foods that are soft and easy to digest. ? Rest often.  Take over-the-counter or prescription medicines only as told by your doctor.  It is up to you to get the results of your procedure. Ask your doctor, or the department performing the procedure, when your results will be ready. To help cramping and bloating:  Try walking around.  Put heat on your belly (abdomen) as told by your doctor. Use a heat source  that your doctor recommends, such as a moist heat pack or a heating pad. ? Put a towel between your skin and the heat source. ? Leave the heat on for 20-30 minutes. ? Remove the heat if your skin turns bright red. This is especially important if you cannot feel pain, heat, or cold. You can get burned. Eating and drinking  Drink enough fluid to keep your pee (urine) clear or pale yellow.  Return to your normal diet as told by your doctor. Avoid heavy or fried foods that are hard to digest.  Avoid drinking alcohol for as long as told by your doctor. Contact a doctor if:  You have blood in your poop (stool) 2-3 days after the procedure. Get help right away if:  You have more than a small amount of blood in your poop.  You see large clumps of tissue (blood clots) in your poop.  Your belly is swollen.  You feel sick to your stomach (nauseous).  You throw up (vomit).  You have a fever.  You have belly pain that gets worse, and medicine does not help your pain. This information is not intended to replace advice given to you by your health care provider. Make sure you discuss any questions you have with your health care provider. Document Released: 12/20/2010 Document Revised: 08/11/2016 Document Reviewed: 08/11/2016 Elsevier Interactive Patient Education  2017 [place] usual medications as before. High-fiber diet. No driving for 24 hours. Physician will call with biopsy results.

## 2018-10-27 NOTE — Op Note (Signed)
[facility] Patient Name: Omar [name] Procedure Date: 10/27/2018 10:16 AM MRN: [id number] Date of Birth: 06-25-1956 Attending MD: Hildred Laser , MD CSN: [id number] Age: 62 Admit Type: Outpatient Procedure:                Colonoscopy Indications:              Screening for colorectal malignant neoplasm Providers:                Hildred Laser, MD, Otis Peak B. Sharon Seller, RN, Nelma Rothman, Technician Referring MD:             Lars Mage, NP Medicines:                Meperidine 50 mg IV, Midazolam 4 mg IV Complications:            No immediate complications. Estimated Blood Loss:     Estimated blood loss was minimal. Procedure:                Pre-Anesthesia Assessment:                           - Prior to the procedure, a History and Physical                            was performed, and patient medications and                            allergies were reviewed. The patient's tolerance of                            previous anesthesia was also reviewed. The risks                            and benefits of the procedure and the sedation                            options and risks were discussed with the patient.                            All questions were answered, and informed consent                            was obtained. Prior Anticoagulants: The patient                            last took aspirin 10 days and naproxen 11 days                            prior to the procedure. ASA Grade Assessment: II -                            A patient with mild systemic disease. After  reviewing the risks and benefits, the patient was                            deemed in satisfactory condition to undergo the                            procedure.                           After obtaining informed consent, the colonoscope                            was passed under direct vision. Throughout the   procedure, the patient's blood pressure, pulse, and                            oxygen saturations were monitored continuously. The                            PCF-[id number] ([id number]) scope was introduced through                            the anus and advanced to the the cecum, identified                            by appendiceal orifice and ileocecal valve. The                            colonoscopy was performed without difficulty. The                            patient tolerated the procedure well. The quality                            of the bowel preparation was good. The ileocecal                            valve, appendiceal orifice, and rectum were                            photographed. Scope In: 10:35:58 AM Scope Out: 11:00:25 AM Scope Withdrawal Time: 0 hours 16 minutes 36 seconds  Total Procedure Duration: 0 hours 24 minutes 27 seconds  Findings:      Skin tags were found on perianal exam.      The digital rectal exam was normal.      Three sessile polyps were found in the cecum. The polyps were diminutive       in size. These were biopsied with a cold forceps for histology. The       pathology specimen was placed into Bottle Number 1.      Multiple medium-mouthed diverticula were found in the sigmoid colon.      External hemorrhoids were found during retroflexion. Impression:               - Perianal skin tags found on perianal exam.                           -  Three diminutive polyps in the cecum. Biopsied.                           - Diverticulosis in the sigmoid colon.                           - External hemorrhoids. Moderate Sedation:      Moderate (conscious) sedation was administered by the endoscopy nurse       and supervised by the endoscopist. The following parameters were       monitored: oxygen saturation, heart rate, blood pressure, CO2       capnography and response to care. Total physician intraservice time was       30 minutes. Recommendation:           -  Patient has a contact number available for                            emergencies. The signs and symptoms of potential                            delayed complications were discussed with the                            patient. Return to normal activities tomorrow.                            Written discharge instructions were provided to the                            patient.                           - High fiber diet today.                           - Continue present medications.                           - No aspirin, ibuprofen, naproxen, or other                            non-steroidal anti-inflammatory drugs for 1 day.                           - Await pathology results.                           - Repeat colonoscopy is recommended. The                            colonoscopy date will be determined after pathology                            results from today's exam become available for  review. Procedure Code(s):        --- Professional ---                           [phone], Colonoscopy, flexible; with biopsy, single                            or multiple                           99153, Moderate sedation; each additional 15                            minutes intraservice time                           G0500, Moderate sedation services provided by the                            same physician or other qualified health care                            professional performing a gastrointestinal                            endoscopic service that sedation supports,                            requiring the presence of an independent trained                            observer to assist in the monitoring of the                            patient's level of consciousness and physiological                            status; initial 15 minutes of intra-service time;                            patient age 53 years or older (additional time may                             be reported with [phone], as appropriate) Diagnosis Code(s):        --- Professional ---                           K64.4, Residual hemorrhoidal skin tags                           Z12.11, Encounter for screening for malignant                            neoplasm of colon  D12.0, Benign neoplasm of cecum                           K57.30, Diverticulosis of large intestine without                            perforation or abscess without bleeding CPT copyright 2018 American Medical Association. All rights reserved. The codes documented in this report are preliminary and upon coder review may  be revised to meet current compliance requirements. Hildred Laser, MD Hildred Laser, MD 10/27/2018 11:09:25 AM This report has been signed electronically. Number of Addenda: 0

## 2018-10-27 NOTE — H&P (Signed)
Omar [name] is an 62 y.o. male.   Chief Complaint: Patient is here for colonoscopy. HPI: Patient is 62 year old Caucasian male who is here for screening colonoscopy.  He denies abdominal pain change in bowel habits or rectal bleeding.  He says last colonoscopy was 10 years ago.  Review of the records reveal it was in June 2010. Family history is negative for CRC.  Past Medical History:  Diagnosis Date  . Anxiety 07-19-2003   Dr.Saaat Humphrey Rolls  . Aortic valve sclerosis January 2014   Noted on echocardiogram to have aortic sclerosis without stenosis. Ordered for murmur  . CAD S/P percutaneous coronary angioplasty 05/30/1996   single -vessel disease (RCA) Rx with POBA on PAMI protocol in june 1997; Re-Look Cath 12/1996: ~20-30% residual no stenosis in 1998 with good LV Function; Myoview 6/'09:  low risk scan ,post EF 58%, this was the fourth such scan in as many years  . Chronic low back pain    Has seen Dr. Marlou Sa  . Dyslipidemia, goal LDL below 70    On statin  . Hypertension   . Lumbosacral disc disease Feb,1 2011   ThIs information from CT done several years ago  . Obstructive sleep apnea 07-19-2003   breathing disorder services,Dr. Steward Drone. 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  . ST elevation myocardial infarction (STEMI) involving right coronary artery in recovery phase ([place]) 05/30/1996   100% occluded RCA -- PTCA-POBA (~20% residual) of RCA (part of PAMI Trial) ;; Normal EF By Echo in 2014    Past Surgical History:  Procedure Laterality Date  . CARDIAC CATHETERIZATION  12-23-1996   ASHD h/o acute DMI with emergency PTCA- randomized to balloon PTCA 05-30-1996 reperfusion time 2 hrs 2 min.,single vessel coronary disease RCA  . HERNIA REPAIR  2005   remote laparscopic hernia repair by Dr.martin in may 2005  . NM MYOVIEW LTD  05/2008   Mild anterior defect consistent with chest wall attenuation.  Normal EF and no ischemia, no infarction  . TRANSTHORACIC ECHOCARDIOGRAM  January 2014   Mild concentric LVH. Normal systolic and diastolic function with EF 55-65%. Mild LA dilation. Aortic sclerosis without stenosis.    Family History  Problem Relation Age of Onset  . Colon cancer Neg Hx    Social History:  reports that he quit smoking about 22 years ago. He has quit using smokeless tobacco. He reports that he drinks alcohol. He reports that he has current or past drug history. Drug: Marijuana.  Allergies: No Known Allergies  Medications Prior to Admission  Medication Sig Dispense Refill  . aspirin 81 MG tablet Take 81 mg by mouth daily.    Marland Kitchen linaclotide (LINZESS) 145 MCG CAPS capsule Take 145 mcg by mouth daily as needed (constipation).     . metoprolol succinate (TOPROL-XL) 25 MG 24 hr tablet TAKE 1/2 TABLET (12.5 MG TOTAL) BY MOUTH DAILY. (Patient taking differently: Take 12.5 mg by mouth daily. ) 15 tablet 8  . naproxen sodium (ALEVE) 220 MG tablet Take 440 mg by mouth daily as needed (pain).    . simvastatin (ZOCOR) 40 MG tablet TAKE 1 TABLET (40 MG TOTAL) BY MOUTH DAILY AT 6 PM. 30 tablet 8  . vitamin E 400 UNIT capsule Take 400 Units by mouth daily.      No results found for this or any previous visit (from the past 48 hour(s)). No results found.  ROS  Blood pressure 132/78, pulse 63, temperature 97.7 F (36.5 C), temperature source Oral, resp. rate 13, height 5\' 9"  (1.753 m), weight 80.3 kg, SpO2 100 %. Physical Exam  Constitutional: He appears well-developed and well-nourished.  HENT:  Mouth/Throat: Oropharynx is clear and moist.  Eyes: Conjunctivae are normal. No scleral icterus.  Neck: No thyromegaly present.  Cardiovascular: Normal rate, regular rhythm and normal heart sounds.  No murmur heard. Respiratory: Effort normal and breath sounds normal.  GI: Soft. He exhibits no distension and no mass. There is no tenderness.  Musculoskeletal: He exhibits no edema.   Lymphadenopathy:    He has no cervical adenopathy.  Neurological: He is alert.  Skin: Skin is warm and dry.     Assessment/Plan Average risk screening colonoscopy.  Hildred Laser, MD 10/27/2018, 10:27 AM

## 2018-11-04 ENCOUNTER — Encounter (HOSPITAL_COMMUNITY): Payer: Self-pay | Admitting: Internal Medicine

## 2019-01-27 ENCOUNTER — Other Ambulatory Visit: Payer: Self-pay | Admitting: Cardiology

## 2019-01-27 NOTE — Telephone Encounter (Signed)
Rx(s) sent to pharmacy electronically.  

## 2019-02-16 ENCOUNTER — Other Ambulatory Visit: Payer: Self-pay | Admitting: Cardiology

## 2019-03-28 ENCOUNTER — Telehealth: Payer: Self-pay | Admitting: *Deleted

## 2019-03-28 NOTE — Telephone Encounter (Signed)
Left message to cal back - need to schedule virtual visit for 04/06/19 w/ harding

## 2019-03-29 NOTE — Telephone Encounter (Signed)
Late entry --Patient called back and spoke to scheduler agreed to virtual visit 03/28/19

## 2019-04-05 ENCOUNTER — Telehealth: Payer: Self-pay | Admitting: Cardiology

## 2019-04-06 ENCOUNTER — Telehealth (INDEPENDENT_AMBULATORY_CARE_PROVIDER_SITE_OTHER): Payer: Medicare Other | Admitting: Cardiology

## 2019-04-06 ENCOUNTER — Telehealth: Payer: Self-pay | Admitting: *Deleted

## 2019-04-06 ENCOUNTER — Encounter: Payer: Self-pay | Admitting: Cardiology

## 2019-04-06 VITALS — Ht 68.0 in | Wt 185.0 lb

## 2019-04-06 DIAGNOSIS — I10 Essential (primary) hypertension: Secondary | ICD-10-CM

## 2019-04-06 DIAGNOSIS — I2510 Atherosclerotic heart disease of native coronary artery without angina pectoris: Secondary | ICD-10-CM

## 2019-04-06 DIAGNOSIS — Z9861 Coronary angioplasty status: Secondary | ICD-10-CM | POA: Diagnosis not present

## 2019-04-06 DIAGNOSIS — G4733 Obstructive sleep apnea (adult) (pediatric): Secondary | ICD-10-CM

## 2019-04-06 DIAGNOSIS — I252 Old myocardial infarction: Secondary | ICD-10-CM

## 2019-04-06 DIAGNOSIS — R739 Hyperglycemia, unspecified: Secondary | ICD-10-CM

## 2019-04-06 DIAGNOSIS — E785 Hyperlipidemia, unspecified: Secondary | ICD-10-CM

## 2019-04-06 NOTE — Telephone Encounter (Signed)
CALLED - LEFT MESSAGE WITH INSTRUCTION FROM TELEVISIT 5/6 - WILL MAIL LABSLIP AND AVS SUMMARY TO PATIENT.   PATIENT REQUEST TO DO LABS - [place] OFFICE.

## 2019-04-06 NOTE — Progress Notes (Signed)
Virtual Visit via Telephone Note   This visit type was conducted due to national recommendations for restrictions regarding the COVID-19 Pandemic (e.g. social distancing) in an effort to limit this patient's exposure and mitigate transmission in our community.  Due to his co-morbid illnesses, this patient is at least at moderate risk for complications without adequate follow up.  This format is felt to be most appropriate for this patient at this time.  The patient did not have access to video technology/had technical difficulties with video requiring transitioning to audio format only (telephone).  All issues noted in this document were discussed and addressed.  No physical exam could be performed with this format.  Please refer to the patient's chart for his  consent to telehealth for [facility].   Patient does not have video capability.  Patient has given verbal permission to conduct this visit via virtual appointment and to bill insurance 03/28/2019 6:19 p.m.  Evaluation Performed:  Follow-up visit  Date:  04/06/2019   ID:  Omar [name], DOB 1956/06/12, MRN [id number]  Patient Location: Other:  [place] Provider Location: Home  PCP:  Bridget Hartshorn, NP; Dr. Edrick Oh Cardiologist:  Glenetta Hew, MD  Electrophysiologist:  None   Chief Complaint:  Annual Follow-up - CAD, H/o Inferior MI  History of Present Illness:    Omar [name] is a 63 y.o. male, with PMH notable for history of inferior STEMI status post PTCA of RCA in 1997,who presents via audio/video conferencing for a telehealth visit today.   He is former patient of Dr. Terance Ice. Coronary disease history began in 1997 with an inferior STEMI.  May 30, 1996 when he presented with an Inferior STEMI -- PTCA of RCA as part of the PAMI trial (randomized to PTCA & not Stent).  As part of the trial he had a re-look cath the following year that showed minimal residual stenosis @ the PTCA site & has had  several negative stress tests since (last one in 2009).  Omar [name] was last seen in May 2019.  He was doing well without any major complaints.  He was started on Linzess for GI issues have been much more stable from the standpoint as well. Not using CPAP for sleep apnea partly because of cost issues but also because of nasal congestion issues.  Would likely require redo of his previous sleep study.  Interval History:  Has been doing well with no major complaints with the exception of back pain.  Has been trying to keep up exercise - playing golf & hunting - occasionally working.  Trying to keep weight down - limited by back pain.  Much less DOE with weight loss.   Cardiovascular ROS: no chest pain or dyspnea on exertion negative for - edema, irregular heartbeat, loss of consciousness, orthopnea, palpitations, paroxysmal nocturnal dyspnea, rapid heart rate, shortness of breath or TIA/amaurosis fugax.  No Melena, hematchezia, hematuria, epistaxis. Claudication   - sleeping better now.  Some daytime sleepiness.  Occasionally takes a nap during the day.   The patient does not have symptoms concerning for COVID-19 infection (fever, chills, cough, or new shortness of breath).  The patient is practicing social distancing even playing golf.   ROS:  Please see the history of present illness.    ROS Nasal congestion-environmental allergies, constipation and gas better with Linzess.  Joint pain shoulders and knees, neck pain; insomnia waking up at night.  Nighttime cramps.  Past Medical History:  Diagnosis Date  .  Anxiety 07-19-2003   Dr.Saaat Humphrey Rolls  . Aortic valve sclerosis January 2014   Noted on echocardiogram to have aortic sclerosis without stenosis. Ordered for murmur  . CAD S/P percutaneous coronary angioplasty 05/30/1996   single -vessel disease (RCA) Rx with POBA on PAMI protocol in june 1997; Re-Look Cath 12/1996: ~20-30% residual no stenosis in 1998 with good LV Function; Myoview  6/'09:  low risk scan ,post EF 58%, this was the fourth such scan in as many years  . Chronic low back pain    Has seen Dr. Marlou Sa  . Dyslipidemia, goal LDL below 70    On statin  . Hypertension   . Lumbosacral disc disease Feb,1 2011   ThIs information from CT done several years ago  . Obstructive sleep apnea 07-19-2003   breathing disorder services,Dr. Steward Drone. 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  . ST elevation myocardial infarction (STEMI) involving right coronary artery in recovery phase ([place]) 05/30/1996   100% occluded RCA -- PTCA-POBA (~20% residual) of RCA (part of PAMI Trial) ;; Normal EF By Echo in 2014   Past Surgical History:  Procedure Laterality Date  . COLONOSCOPY N/A 10/27/2018   Procedure: COLONOSCOPY;  Surgeon: Rogene Houston, MD;  Location: [facility];  Service: Endoscopy;  Laterality: N/A;  1030  . HERNIA REPAIR  2005   remote laparscopic hernia repair by Dr.martin in may 2005  . LEFT HEART CATH AND CORONARY ANGIOGRAPHY  12-23-1996   ASHD h/o acute DMI with emergency PTCA- randomized to balloon PTCA 05-30-1996 reperfusion time 2 hrs 2 min.,single vessel coronary disease RCA  . NM MYOVIEW LTD  05/2008   Mild anterior defect consistent with chest wall attenuation. Normal EF and no ischemia, no infarction  . POLYPECTOMY  10/27/2018   Procedure: POLYPECTOMY;  Surgeon: Rogene Houston, MD;  Location: [facility];  Service: Endoscopy;;  colon  . TRANSTHORACIC ECHOCARDIOGRAM  January 2014   Mild concentric LVH. Normal systolic and diastolic function with EF 55-65%. Mild LA dilation. Aortic sclerosis without stenosis.     Current Meds  Medication Sig  . aspirin 81 MG tablet Take 81 mg by mouth daily.  . metoprolol succinate (TOPROL-XL) 25 MG 24 hr tablet TAKE 1/2 TABLET (12.5 MG TOTAL) BY MOUTH DAILY.  . naproxen sodium (ALEVE) 220 MG tablet Take 440 mg by mouth daily as needed  (pain).  . simvastatin (ZOCOR) 40 MG tablet Take 1 tablet (40 mg total) by mouth daily at 6 PM.  . vitamin E 400 UNIT capsule Take 400 Units by mouth daily.  . [DISCONTINUED] linaclotide (LINZESS) 145 MCG CAPS capsule Take 145 mcg by mouth daily as needed (constipation).      Allergies:   Patient has no known allergies.   Social History   Tobacco Use  . Smoking status: Former Smoker    Last attempt to quit: 12/02/1995    Years since quitting: 23.3  . Smokeless tobacco: Former Network engineer Use Topics  . Alcohol use: Yes    Comment: occasionally  . Drug use: Not Currently    Types: Marijuana    Comment: in the 1960's     Family Hx: The patient's family history is negative for Colon cancer.   Prior CV studies:   The following studies were reviewed today: . None:  Labs/Other Tests and Data Reviewed:    EKG:  No ECG reviewed.  Recent Labs:Due  to get labs from PCP soon.   Lab Results  Component Value Date   CREATININE 0.87 03/31/2018   BUN 14 03/31/2018   NA 142 03/31/2018   K 5.1 03/31/2018   CL 103 03/31/2018   CO2 26 03/31/2018   Lab Results  Component Value Date   HGBA1C 5.7 (H) 03/31/2018    Recent Lipid Panel Lab Results  Component Value Date/Time   CHOL 164 03/31/2018 08:28 AM   TRIG 139 03/31/2018 08:28 AM   HDL 45 03/31/2018 08:28 AM   CHOLHDL 3.6 03/31/2018 08:28 AM   CHOLHDL 3.4 03/31/2017 09:47 AM   LDLCALC 91 03/31/2018 08:28 AM    Wt Readings from Last 3 Encounters:  04/06/19 185 lb (83.9 kg)  10/27/18 177 lb (80.3 kg)  03/31/18 189 lb 3.2 oz (85.8 kg)   -- ~185 lb  Objective:    Vital Signs:  Ht 5\' 8"  (1.727 m)   Wt 185 lb (83.9 kg)   BMI 28.13 kg/m  ; BP has been OK when checked @ drug store.  Normal to low according to the chart.  VITAL SIGNS:  Reviewed-high blood pressure recorded GEN: Healthy sounding gentleman no acute distress. RESPIRATORY:  normal respiratory effort, symmetric expansion CARDIOVASCULAR:  no peripheral edema  reported NEURO / PSYCH:  alert and oriented x 3,Normal mood and affect  ASSESSMENT & PLAN:    Problem List Items Addressed This Visit    CAD S/P percutaneous coronary angioplasty - Primary (Chronic)   Relevant Orders   Lipid panel   Comprehensive metabolic panel   Hemoglobin A1c   Elevated blood sugar level   Relevant Orders   Comprehensive metabolic panel   Hemoglobin A1c   Essential hypertension (Chronic)   Relevant Orders   Comprehensive metabolic panel   History of acute inferior wall MI (Chronic)   Relevant Orders   Comprehensive metabolic panel   Hyperlipidemia with target LDL less than 70 (Chronic)   Relevant Orders   Lipid panel   Comprehensive metabolic panel   Hemoglobin A1c   Obstructive sleep apnea (Chronic)     Omar [name] is doing very well no active cardiac symptoms--no angina or CHF.  He says his blood pressures been relatively well controlled.  Staying active.  Less dyspneic since lost weight.  Sleeping better now for probably we can forego further sleep apnea evaluation at this time.  Plan: Continue aspirin, low-dose beta-blocker and statin for CAD.  Due for lab evaluation: CMP, fasting lipid panel, A1c.  I have asked that he continue to follow-up with his blood pressures intermittently to ensure they are staying in the normal range.  Most recent lipid check showed LDL not quite at goal, low threshold for converting from simvastatin to rosuvastatin 40 mg.  Continue to encourage weight loss, diet and exercise.   COVID-19 Education: The signs and symptoms of COVID-19 were discussed with the patient and how to seek care for testing (follow up with PCP or arrange E-visit).   The importance of social distancing was discussed today.  Time:   Today, I have spent 13 minutes with the patient with telehealth technology discussing the above problems.     Medication Adjustments/Labs and Tests Ordered: Current medicines are reviewed at length with the patient  today.  Concerns regarding medicines are outlined above.   Medication Instructions: none   Tests Ordered: Orders Placed This Encounter  Procedures  . Lipid panel  . Comprehensive metabolic panel  . Hemoglobin A1c   -- get labs (CMP,  Lipids & A1c) checked @ PCP office in [place].  Medication Changes: No orders of the defined types were placed in this encounter.   Disposition:  Follow up in 6 month(s)    Signed, Glenetta Hew, MD  04/06/2019 12:52 PM    [facility]

## 2019-04-06 NOTE — Patient Instructions (Addendum)
Medication Instructions:  --No new changes  If you need a refill on your cardiac medications before your next appointment, please call your pharmacy.   Lab work:   -- get labs (CMP, Lipids & A1c) checked @ PCP office in [place].  If you have labs (blood work) drawn today and your tests are completely normal, you will receive your results only by: Marland Kitchen MyChart Message (if you have MyChart) OR . A paper copy in the mail If you have any lab test that is abnormal or we need to change your treatment, we will call you to review the results.  Testing/Procedures:  NOT NEEDED   Follow-Up: At [facility], you and your health needs are our priority.  As part of our continuing mission to provide you with exceptional heart care, we have created designated Provider Care Teams.  These Care Teams include your primary Cardiologist (physician) and Advanced Practice Providers (APPs -  Physician Assistants and Nurse Practitioners) who all work together to provide you with the care you need, when you need it. You will need a follow up appointment in 6 months November 2020.  Please call our office 2 months in advance to schedule this appointment.  You may see Glenetta Hew, MD or one of the following Advanced Practice Providers on your designated Care Team:   Rosaria Ferries, PA-C . Jory Sims, DNP, ANP  Any Other Special Instructions Will Be Listed Below (If Applicable).

## 2019-04-29 LAB — COMPREHENSIVE METABOLIC PANEL
ALT: 14 IU/L (ref 0–44)
AST: 21 IU/L (ref 0–40)
Albumin/Globulin Ratio: 2.3 — ABNORMAL HIGH (ref 1.2–2.2)
Albumin: 4.6 g/dL (ref 3.8–4.8)
Alkaline Phosphatase: 103 IU/L (ref 39–117)
BUN/Creatinine Ratio: 15 (ref 10–24)
BUN: 14 mg/dL (ref 8–27)
Bilirubin Total: 0.9 mg/dL (ref 0.0–1.2)
CO2: 22 mmol/L (ref 20–29)
Calcium: 9.5 mg/dL (ref 8.6–10.2)
Chloride: 102 mmol/L (ref 96–106)
Creatinine, Ser: 0.94 mg/dL (ref 0.76–1.27)
GFR calc Af Amer: 98 mL/min/{1.73_m2} (ref >59)
GFR calc non Af Amer: 85 mL/min/{1.73_m2} (ref >59)
Globulin, Total: 2 g/dL (ref 1.5–4.5)
Glucose: 90 mg/dL (ref 65–99)
Potassium: 4.5 mmol/L (ref 3.5–5.2)
Sodium: 139 mmol/L (ref 134–144)
Total Protein: 6.6 g/dL (ref 6.0–8.5)

## 2019-04-29 LAB — HEMOGLOBIN A1C
Est. average glucose Bld gHb Est-mCnc: 120 mg/dL
Hgb A1c MFr Bld: 5.8 % — ABNORMAL HIGH (ref 4.8–5.6)

## 2019-04-29 LAB — LIPID PANEL
Chol/HDL Ratio: 3.2 ratio (ref 0.0–5.0)
Cholesterol, Total: 155 mg/dL (ref 100–199)
HDL: 48 mg/dL (ref >39)
LDL Calculated: 84 mg/dL (ref 0–99)
Triglycerides: 113 mg/dL (ref 0–149)
VLDL Cholesterol Cal: 23 mg/dL (ref 5–40)

## 2019-05-04 ENCOUNTER — Telehealth: Payer: Self-pay | Admitting: *Deleted

## 2019-05-04 MED ORDER — ATORVASTATIN CALCIUM 40 MG PO TABS: 40.0000 mg | ORAL_TABLET | Freq: Every day | ORAL | 3 refills | Status: DC

## 2019-05-04 NOTE — Telephone Encounter (Signed)
The patient has been notified of the result and verbalized understanding.  All questions (if any) were answered. Prescription e-sent to pharmacy-- atorvastatin 40 mg daily  Raiford Simmonds, [place] 05/04/2019 1:36 PM

## 2019-05-04 NOTE — Telephone Encounter (Signed)
-----   Message from Leonie Man, MD sent at 05/02/2019  5:02 PM EDT ----- Cholesterol level unfortunately still shows an LDL at 84.  Our goal is to try to get it less than 70 if not closer to 50.  I would like to switch him from simvastatin 40 mg daily to atorvastatin 40 mg daily.  New prescription: Atorvastatin 40 mg p.o. daily, dispense 90 tabs, refill 4.  Glenetta Hew, MD

## 2019-05-10 ENCOUNTER — Other Ambulatory Visit: Payer: Self-pay | Admitting: Cardiology

## 2019-05-31 NOTE — Telephone Encounter (Signed)
Open n error °

## 2019-07-05 ENCOUNTER — Ambulatory Visit: Payer: BLUE CROSS/BLUE SHIELD | Admitting: Physician Assistant

## 2019-07-08 ENCOUNTER — Ambulatory Visit: Payer: BLUE CROSS/BLUE SHIELD | Admitting: Physician Assistant

## 2019-10-11 ENCOUNTER — Encounter: Payer: Self-pay | Admitting: Physician Assistant

## 2019-10-11 ENCOUNTER — Other Ambulatory Visit: Payer: Self-pay

## 2019-10-11 ENCOUNTER — Ambulatory Visit (INDEPENDENT_AMBULATORY_CARE_PROVIDER_SITE_OTHER): Payer: Medicare Other | Admitting: Physician Assistant

## 2019-10-11 VITALS — BP 124/75 | HR 60 | Temp 97.1°F | Ht 68.0 in | Wt 188.6 lb

## 2019-10-11 DIAGNOSIS — Z0000 Encounter for general adult medical examination without abnormal findings: Secondary | ICD-10-CM

## 2019-10-11 DIAGNOSIS — K5901 Slow transit constipation: Secondary | ICD-10-CM | POA: Diagnosis not present

## 2019-10-11 DIAGNOSIS — Z23 Encounter for immunization: Secondary | ICD-10-CM

## 2019-10-11 DIAGNOSIS — I10 Essential (primary) hypertension: Secondary | ICD-10-CM

## 2019-10-11 DIAGNOSIS — E785 Hyperlipidemia, unspecified: Secondary | ICD-10-CM

## 2019-10-12 LAB — CBC WITH DIFFERENTIAL/PLATELET
Basophils Absolute: 0.1 10*3/uL (ref 0.0–0.2)
Basos: 1 %
EOS (ABSOLUTE): 0.3 10*3/uL (ref 0.0–0.4)
Eos: 4 %
Hematocrit: 43.3 % (ref 37.5–51.0)
Hemoglobin: 14.8 g/dL (ref 13.0–17.7)
Immature Grans (Abs): 0 10*3/uL (ref 0.0–0.1)
Immature Granulocytes: 0 %
Lymphocytes Absolute: 1.6 10*3/uL (ref 0.7–3.1)
Lymphs: 24 %
MCH: 31.4 pg (ref 26.6–33.0)
MCHC: 34.2 g/dL (ref 31.5–35.7)
MCV: 92 fL (ref 79–97)
Monocytes Absolute: 0.7 10*3/uL (ref 0.1–0.9)
Monocytes: 10 %
Neutrophils Absolute: 3.9 10*3/uL (ref 1.4–7.0)
Neutrophils: 61 %
Platelets: 235 10*3/uL (ref 150–450)
RBC: 4.71 x10E6/uL (ref 4.14–5.80)
RDW: 12.5 % (ref 11.6–15.4)
WBC: 6.4 10*3/uL (ref 3.4–10.8)

## 2019-10-12 LAB — CMP14+EGFR
ALT: 17 IU/L (ref 0–44)
AST: 18 IU/L (ref 0–40)
Albumin/Globulin Ratio: 2.2 (ref 1.2–2.2)
Albumin: 4.6 g/dL (ref 3.8–4.8)
Alkaline Phosphatase: 122 IU/L — ABNORMAL HIGH (ref 39–117)
BUN/Creatinine Ratio: 20 (ref 10–24)
BUN: 17 mg/dL (ref 8–27)
Bilirubin Total: 1 mg/dL (ref 0.0–1.2)
CO2: 23 mmol/L (ref 20–29)
Calcium: 9.9 mg/dL (ref 8.6–10.2)
Chloride: 106 mmol/L (ref 96–106)
Creatinine, Ser: 0.84 mg/dL (ref 0.76–1.27)
GFR calc Af Amer: 106 mL/min/{1.73_m2} (ref >59)
GFR calc non Af Amer: 92 mL/min/{1.73_m2} (ref >59)
Globulin, Total: 2.1 g/dL (ref 1.5–4.5)
Glucose: 91 mg/dL (ref 65–99)
Potassium: 5.5 mmol/L — ABNORMAL HIGH (ref 3.5–5.2)
Sodium: 143 mmol/L (ref 134–144)
Total Protein: 6.7 g/dL (ref 6.0–8.5)

## 2019-10-12 LAB — PSA: Prostate Specific Ag, Serum: 0.6 ng/mL (ref 0.0–4.0)

## 2019-10-13 ENCOUNTER — Encounter: Payer: Self-pay | Admitting: Physician Assistant

## 2019-10-13 ENCOUNTER — Other Ambulatory Visit: Payer: Self-pay | Admitting: Physician Assistant

## 2019-10-13 DIAGNOSIS — E875 Hyperkalemia: Secondary | ICD-10-CM

## 2019-10-13 NOTE — Progress Notes (Signed)
BP 124/75   Pulse 60   Temp (!) 97.1 F (36.2 C) (Temporal)   Ht 5' 8"  (1.727 m)   Wt 188 lb 9.6 oz (85.5 kg)   SpO2 99%   BMI 28.68 kg/m    Subjective:    Patient ID: Omar [name], male    DOB: 06/16/1956, 63 y.o.   MRN: [id number]  HPI: Omar [name] is a 63 y.o. male presenting on 10/11/2019 for New Patient (Initial Visit)  The patient reports that he is coming in to establish.  He is a longtime patient of Dr. Edrick Oh.  The patient does have cardiology.  He had an MI and has been doing very well since then.  He does take his medications and follows up with cardiology as needed.  We will have labs performed today.  His past medical history is positive for the coronary artery disease, hypertension, sleep apnea, chronic chronic constipation, lumbar disc disease, hyperlipidemia.  All of his medications are reviewed and also his chart and care everywhere.  Past Medical History:  Diagnosis Date  . Anxiety 07-19-2003   Dr.Saaat Humphrey Rolls  . Aortic valve sclerosis January 2014   Noted on echocardiogram to have aortic sclerosis without stenosis. Ordered for murmur  . CAD S/P percutaneous coronary angioplasty 05/30/1996   single -vessel disease (RCA) Rx with POBA on PAMI protocol in june 1997; Re-Look Cath 12/1996: ~20-30% residual no stenosis in 1998 with good LV Function; Myoview 6/'09:  low risk scan ,post EF 58%, this was the fourth such scan in as many years  . Chronic low back pain    Has seen Dr. Marlou Sa  . Dyslipidemia, goal LDL below 70    On statin  . Hypertension   . Lumbosacral disc disease Feb,1 2011   ThIs information from CT done several years ago  . Obstructive sleep apnea 07-19-2003   breathing disorder services,Dr. Steward Drone. 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  . ST elevation myocardial infarction (STEMI) involving right coronary artery in recovery phase ([place]) 05/30/1996   100% occluded RCA -- PTCA-POBA (~20% residual) of RCA (part of PAMI Trial) ;; Normal EF By Echo in 2014   Relevant past medical, surgical, family and social history reviewed and updated as indicated. Interim medical history since our last visit reviewed. Allergies and medications reviewed and updated. DATA REVIEWED: CHART IN EPIC  Family History reviewed for pertinent findings.  Review of Systems  Constitutional: Negative.  Negative for appetite change and fatigue.  Eyes: Negative for pain and visual disturbance.  Respiratory: Negative.  Negative for cough, chest tightness, shortness of breath and wheezing.   Cardiovascular: Negative.  Negative for chest pain, palpitations and leg swelling.  Gastrointestinal: Positive for constipation. Negative for abdominal pain, diarrhea, nausea and vomiting.  Genitourinary: Negative.   Skin: Negative.  Negative for color change and rash.  Neurological: Negative.  Negative for weakness, numbness and headaches.  Psychiatric/Behavioral: Negative.     Allergies as of 10/11/2019   No Known Allergies     Medication List       Accurate as of October 11, 2019 11:59 PM. If you have any questions, ask your nurse or doctor.        STOP taking these medications   naproxen sodium 220 MG tablet Commonly known as: ALEVE Stopped by: Terald Sleeper, PA-C   simvastatin 40 MG tablet Commonly known as: ZOCOR Stopped by:  Terald Sleeper, PA-C     TAKE these medications   aspirin 81 MG tablet Take 81 mg by mouth daily.   atorvastatin 40 MG tablet Commonly known as: LIPITOR Take 1 tablet (40 mg total) by mouth daily.   metoprolol succinate 25 MG 24 hr tablet Commonly known as: TOPROL-XL TAKE 1/2 TABLET (12.5 MG TOTAL) BY MOUTH DAILY.   vitamin E 400 UNIT capsule Take 400 Units by mouth daily.          Objective:    BP 124/75   Pulse 60   Temp (!) 97.1 F (36.2 C) (Temporal)   Ht 5' 8"  (1.727 m)   Wt 188 lb 9.6 oz (85.5 kg)   SpO2 99%    BMI 28.68 kg/m   No Known Allergies  Wt Readings from Last 3 Encounters:  10/11/19 188 lb 9.6 oz (85.5 kg)  04/06/19 185 lb (83.9 kg)  10/27/18 177 lb (80.3 kg)    Physical Exam Vitals signs and nursing note reviewed.  Constitutional:      General: He is not in acute distress.    Appearance: He is well-developed.  HENT:     Head: Normocephalic and atraumatic.  Eyes:     Conjunctiva/sclera: Conjunctivae normal.     Pupils: Pupils are equal, round, and reactive to light.  Cardiovascular:     Rate and Rhythm: Normal rate and regular rhythm.     Heart sounds: Normal heart sounds.  Pulmonary:     Effort: Pulmonary effort is normal. No respiratory distress.     Breath sounds: Normal breath sounds.  Skin:    General: Skin is warm and dry.  Psychiatric:        Behavior: Behavior normal.     Results for orders placed or performed in visit on 10/11/19  CBC with Differential/Platelet  Result Value Ref Range   WBC 6.4 3.4 - 10.8 x10E3/uL   RBC 4.71 4.14 - 5.80 x10E6/uL   Hemoglobin 14.8 13.0 - 17.7 g/dL   Hematocrit 43.3 37.5 - 51.0 %   MCV 92 79 - 97 fL   MCH 31.4 26.6 - 33.0 pg   MCHC 34.2 31.5 - 35.7 g/dL   RDW 12.5 11.6 - 15.4 %   Platelets 235 150 - 450 x10E3/uL   Neutrophils 61 Not Estab. %   Lymphs 24 Not Estab. %   Monocytes 10 Not Estab. %   Eos 4 Not Estab. %   Basos 1 Not Estab. %   Neutrophils Absolute 3.9 1.4 - 7.0 x10E3/uL   Lymphocytes Absolute 1.6 0.7 - 3.1 x10E3/uL   Monocytes Absolute 0.7 0.1 - 0.9 x10E3/uL   EOS (ABSOLUTE) 0.3 0.0 - 0.4 x10E3/uL   Basophils Absolute 0.1 0.0 - 0.2 x10E3/uL   Immature Granulocytes 0 Not Estab. %   Immature Grans (Abs) 0.0 0.0 - 0.1 x10E3/uL  CMP14+EGFR  Result Value Ref Range   Glucose 91 65 - 99 mg/dL   BUN 17 8 - 27 mg/dL   Creatinine, Ser 0.84 0.76 - 1.27 mg/dL   GFR calc non Af Amer 92 >59 mL/min/1.73   GFR calc Af Amer 106 >59 mL/min/1.73   BUN/Creatinine Ratio 20 10 - 24   Sodium 143 134 - 144 mmol/L    Potassium 5.5 (H) 3.5 - 5.2 mmol/L   Chloride 106 96 - 106 mmol/L   CO2 23 20 - 29 mmol/L   Calcium 9.9 8.6 - 10.2 mg/dL   Total Protein 6.7 6.0 - 8.5 g/dL  Albumin 4.6 3.8 - 4.8 g/dL   Globulin, Total 2.1 1.5 - 4.5 g/dL   Albumin/Globulin Ratio 2.2 1.2 - 2.2   Bilirubin Total 1.0 0.0 - 1.2 mg/dL   Alkaline Phosphatase 122 (H) 39 - 117 IU/L   AST 18 0 - 40 IU/L   ALT 17 0 - 44 IU/L  PSA  Result Value Ref Range   Prostate Specific Ag, Serum 0.6 0.0 - 4.0 ng/mL      Assessment & Plan:   1. Constipation by delayed colonic transit MiraLAX daily, 1/2-1 daily  2. Essential hypertension Continue metoprolol  3. Hyperlipidemia with target LDL less than 70 Continue simvastatin  4. Well adult exam - CBC with Differential/Platelet - CMP14+EGFR - PSA  5. Need for immunization against influenza - Flu Vaccine QUAD High Dose(Fluad)   Continue all other maintenance medications as listed above.  Follow up plan: No follow-ups on file.  Educational handout given for hyperlipid  Terald Sleeper PA-C [place] [address]  [place], [place] [address] [phone]   10/13/2019, 3:23 PM

## 2019-10-20 ENCOUNTER — Other Ambulatory Visit: Payer: Self-pay

## 2019-10-20 ENCOUNTER — Encounter: Payer: Self-pay | Admitting: Cardiology

## 2019-10-20 ENCOUNTER — Ambulatory Visit: Payer: Medicare Other | Admitting: Cardiology

## 2019-10-20 VITALS — BP 126/80 | HR 61 | Temp 97.2°F | Ht 68.0 in | Wt 189.0 lb

## 2019-10-20 DIAGNOSIS — I10 Essential (primary) hypertension: Secondary | ICD-10-CM | POA: Diagnosis not present

## 2019-10-20 DIAGNOSIS — G4762 Sleep related leg cramps: Secondary | ICD-10-CM | POA: Diagnosis not present

## 2019-10-20 DIAGNOSIS — I25119 Atherosclerotic heart disease of native coronary artery with unspecified angina pectoris: Secondary | ICD-10-CM | POA: Diagnosis not present

## 2019-10-20 DIAGNOSIS — K5901 Slow transit constipation: Secondary | ICD-10-CM | POA: Diagnosis not present

## 2019-10-20 DIAGNOSIS — E785 Hyperlipidemia, unspecified: Secondary | ICD-10-CM

## 2019-10-20 DIAGNOSIS — I208 Other forms of angina pectoris: Secondary | ICD-10-CM | POA: Diagnosis not present

## 2019-10-20 LAB — HEPATIC FUNCTION PANEL
ALT: 20 IU/L (ref 0–44)
AST: 21 IU/L (ref 0–40)
Albumin: 4.7 g/dL (ref 3.8–4.8)
Alkaline Phosphatase: 115 IU/L (ref 39–117)
Bilirubin Total: 1.3 mg/dL — ABNORMAL HIGH (ref 0.0–1.2)
Bilirubin, Direct: 0.3 mg/dL (ref 0.00–0.40)
Total Protein: 7.1 g/dL (ref 6.0–8.5)

## 2019-10-20 LAB — LIPID PANEL
Chol/HDL Ratio: 3.4 ratio (ref 0.0–5.0)
Cholesterol, Total: 168 mg/dL (ref 100–199)
HDL: 49 mg/dL (ref >39)
LDL Chol Calc (NIH): 99 mg/dL (ref 0–99)
Triglycerides: 111 mg/dL (ref 0–149)
VLDL Cholesterol Cal: 20 mg/dL (ref 5–40)

## 2019-10-20 MED ORDER — AMLODIPINE BESYLATE 2.5 MG PO TABS: 2.5000 mg | ORAL_TABLET | Freq: Every day | ORAL | 3 refills | Status: DC

## 2019-10-20 NOTE — Progress Notes (Signed)
Primary Care Provider: Terald Sleeper, PA-C Cardiologist: Glenetta Hew, MD Electrophysiologist:   Clinic Note: Chief Complaint  Patient presents with  . Follow-up    6 months.  . Coronary Artery Disease    No angina  . Hyperlipidemia    HPI:    Omar [name] is a 63 y.o. male with a h/o Inf STEMI (PCI RCA - 1997) below who presents today for 63-month follow-up.  He is former patient of Dr. Terance Ice.   May 30, 1996-inferior STEMI: PTCA of RCA as part of the PAMI trial (randomized to PTCA & not Stent).   As part of the trial he had a re-look cath the following year that showed minimal residual stenosis @ the PTCA site & has had several negative stress tests since (last one in 2009).  Omar [name] was last seen on via Telehealth in May 2020 --> only noted back pain. Working on keeping wgt off. No real CV Sx.   Recent Hospitalizations: none  Reviewed  CV studies:    The following studies were reviewed today: (if available, images/films reviewed: From Epic Chart or Care Everywhere) . none:   Interval History:   Omar [name] returns here today for in person evaluation doing fairly well overall from a cardiac standpoint.  He says he may get an occasional "burning sensation in his chest if he does a lot of activity, but really has not had this symptom with normal exercise.  He is not using nitroglycerin.  Usually when he stops it goes away.  He does not happen with his routine walking or even when chopping a whole load of firewood.  He noted at the last when he was climbing up out of a "hollow" carrying his hunting equipment.  About the only thing he notes is cramping in his legs that can happen most often at night, but sometimes during the day when he is not doing anything.  He does not have it when he is exerting himself.  The cramps are so bad that awaken him from sleep.  He is tried lots of things and is still not helping.   CV Review of Symptoms  (Summary) positive for - Chest burning with significant exertion noted but not with routine negative for - dyspnea on exertion, edema, irregular heartbeat, orthopnea, palpitations, paroxysmal nocturnal dyspnea, rapid heart rate, shortness of breath or Syncope/near syncope, TIA shows amaurosis fugax, claudication  The patient does not have symptoms concerning for COVID-19 infection (fever, chills, cough, or new shortness of breath).  The patient is practicing social distancing. ++ Masking.  He does go out off & on for Groceries/shopping. Still does yard work, would shopping etc.   [place]   A comprehensive ROS was performed. Review of Systems  Constitutional: Negative for malaise/fatigue and weight loss.  HENT: Negative for congestion and nosebleeds.   Respiratory: Negative for shortness of breath.   Gastrointestinal: Positive for constipation (He stopped taking Linzess, since then he has been having lots of intermittent constipation). Negative for blood in stool and melena.  Genitourinary: Negative for hematuria.  Musculoskeletal: Positive for joint pain (Still has pains in his shoulders and hips and knees.) and myalgias (Extensive cramping as noted above just her every night.).  Neurological: Negative for dizziness, focal weakness and headaches.  Endo/Heme/Allergies: Positive for environmental allergies. Does not bruise/bleed easily.  Psychiatric/Behavioral: The patient has insomnia (More related to cramps.).   All other systems reviewed and are negative.   I  have reviewed and (if needed) personally updated the patient's problem list, medications, allergies, past medical and surgical history, social and family history.   PAST MEDICAL HISTORY   Past Medical History:  Diagnosis Date  . Anxiety 07-19-2003   Dr.Saaat Humphrey Rolls  . Aortic valve sclerosis January 2014   Noted on echocardiogram to have aortic sclerosis without stenosis. Ordered for murmur  . CAD S/P percutaneous  coronary angioplasty 05/30/1996   single -vessel disease (RCA) Rx with POBA on PAMI protocol in june 1997; Re-Look Cath 12/1996: ~20-30% residual no stenosis in 1998 with good LV Function; Myoview 6/'09:  low risk scan ,post EF 58%, this was the fourth such scan in as many years  . Chronic low back pain    Has seen Dr. Marlou Sa  . Dyslipidemia, goal LDL below 70    On statin  . Hypertension   . Lumbosacral disc disease Feb,1 2011   ThIs information from CT done several years ago  . Obstructive sleep apnea 07-19-2003   breathing disorder services,Dr. Steward Drone. 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  . ST elevation myocardial infarction (STEMI) involving right coronary artery in recovery phase ([place]) 05/30/1996   100% occluded RCA -- PTCA-POBA (~20% residual) of RCA (part of PAMI Trial) ;; Normal EF By Echo in 2014    PAST SURGICAL HISTORY   Past Surgical History:  Procedure Laterality Date  . COLONOSCOPY N/A 10/27/2018   Procedure: COLONOSCOPY;  Surgeon: Rogene Houston, MD;  Location: [facility];  Service: Endoscopy;  Laterality: N/A;  1030  . HERNIA REPAIR  2005   remote laparscopic hernia repair by Dr.martin in may 2005  . LEFT HEART CATH AND CORONARY ANGIOGRAPHY  12-23-1996   ASHD h/o acute DMI with emergency PTCA- randomized to balloon PTCA 05-30-1996 reperfusion time 2 hrs 2 min.,single vessel coronary disease RCA  . NM MYOVIEW LTD  05/2008   Mild anterior defect consistent with chest wall attenuation. Normal EF and no ischemia, no infarction  . POLYPECTOMY  10/27/2018   Procedure: POLYPECTOMY;  Surgeon: Rogene Houston, MD;  Location: [facility];  Service: Endoscopy;;  colon  . TRANSTHORACIC ECHOCARDIOGRAM  January 2014   Mild concentric LVH. Normal systolic and diastolic function with EF 55-65%. Mild LA dilation. Aortic sclerosis without stenosis.     MEDICATIONS/ALLERGIES   Current Meds   Medication Sig  . aspirin 81 MG tablet Take 81 mg by mouth daily.  . metoprolol succinate (TOPROL-XL) 25 MG 24 hr tablet TAKE 1/2 TABLET (12.5 MG TOTAL) BY MOUTH DAILY.  . vitamin E 400 UNIT capsule Take 400 Units by mouth daily.  . [DISCONTINUED] atorvastatin (LIPITOR) 40 MG tablet Take 1 tablet (40 mg total) by mouth daily.    No Known Allergies   SOCIAL HISTORY/FAMILY HISTORY   Social History   Tobacco Use  . Smoking status: Former Smoker    Quit date: 12/02/1995    Years since quitting: 23.9  . Smokeless tobacco: Former Network engineer Use Topics  . Alcohol use: Yes    Comment: occasionally  . Drug use: Not Currently    Types: Marijuana    Comment: in the 1960's   Social History   Social History Narrative   Married father of 2 with 3 children. He quit smoking in 1997.   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 He does drink not alcohol.    Family History  family history includes Healthy in his mother; Other in his father.  He is not aware of any premature coronary artery disease with his family.  Not very familiar with his family history.   OBJCTIVE -PE, EKG, labs   Wt Readings from Last 3 Encounters:  10/20/19 189 lb (85.7 kg)  10/11/19 188 lb 9.6 oz (85.5 kg)  04/06/19 185 lb (83.9 kg)    Physical Exam: BP 126/80 (BP Location: Left Arm, Patient Position: Sitting, Cuff Size: Normal)   Pulse 61   Temp (!) 97.2 F (36.2 C)   Ht 5\' 8"  (1.727 m)   Wt 189 lb (85.7 kg)   BMI 28.74 kg/m  Physical Exam  Constitutional: He is oriented to person, place, and time. He appears well-developed and well-nourished. No distress.  Healthy-appearing.  Well-groomed.  HENT:  Head: Normocephalic and atraumatic.  Neck: Normal range of motion. Neck supple. No hepatojugular reflux and no JVD present. Carotid bruit is not present.  Cardiovascular: Normal rate, regular rhythm, S1 normal, S2 normal and intact distal pulses.  Occasional extrasystoles are present. PMI is not displaced.  Exam reveals gallop (Cannot exclude soft S4), S4 and distant heart sounds.  Murmur heard.  Harsh crescendo-decrescendo early systolic murmur is present with a grade of 1/6 at the upper right sternal border radiating to the neck. Pulmonary/Chest: Effort normal and breath sounds normal. No respiratory distress. He has no wheezes. He has no rales.  Abdominal: Soft. Bowel sounds are normal. He exhibits no distension. There is no abdominal tenderness. There is no rebound.  Musculoskeletal: Normal range of motion.        General: No edema.  Neurological: He is alert and oriented to person, place, and time.  Psychiatric: He has a normal mood and affect. His behavior is normal. Judgment and thought content normal.    Adult ECG Report  Rate: 61 ;  Rhythm: normal sinus rhythm and Normal axis, intervals and durations.;   Narrative Interpretation: Normal EKG  Recent Labs: Checked today Lab Results  Component Value Date   CHOL 168 10/20/2019   HDL 49 10/20/2019   LDLCALC 99 10/20/2019   TRIG 111 10/20/2019   CHOLHDL 3.4 10/20/2019   Lab Results  Component Value Date   CREATININE 0.84 10/11/2019   BUN 17 10/11/2019   NA 143 10/11/2019   K 5.5 (H) 10/11/2019   CL 106 10/11/2019   CO2 23 10/11/2019    ASSESSMENT/PLAN    Problem List Items Addressed This Visit    Coronary artery disease involving native coronary artery of native heart with angina pectoris ([place]) - Primary (Chronic)    Distant history of PTCA only of the RCA back in 97.  Has had several follow-up Myoview evaluations done but none recently.  If his exertional chest burning symptoms were to worsen, would warrant repeat testing, but for now we will hold off. Barely able to tolerate the 12.5 of Toprol.  We will add 2.5 mg amlodipine. Is on atorvastatin at moderate dose and not noting significant cramping.  Planning statin holiday and likely converting to rosuvastatin. Is on aspirin for maintenance therapy.      Relevant  Medications   amLODipine (NORVASC) 2.5 MG tablet   Other Relevant Orders   EKG 12-Lead   Lipid panel (Completed)   Hepatic function panel (Completed)   Lipid panel   Hepatic function panel   Stable angina (HCC) (Chronic)    He is intermittently had this noting of burning in his chest when he  exerts himself, but this the first, cardiac this year.  Not with routine exertion.  Not really an activity.  Relatively stable.  We will continue medical management.    Plan will be to add amlodipine 2.5 mg as he is not able to tolerate any further dosing of beta-blocker.  Refill nitroglycerin.      Relevant Medications   amLODipine (NORVASC) 2.5 MG tablet   Other Relevant Orders   EKG 12-Lead   Hyperlipidemia with target LDL less than 70 (Chronic)    Not at goal as of yet - changed to atorvastatin - but now with cramps.  We check baseline levels of lipids today: We will allow for a 30 d statin holiday to assess as cause of cramping.  Pending improvement of Sx - will either change to rosuvastatin 20 mg + Zetia 10mg  (if Sx better), or rosuvastatin 40 mg if Sx not changed.  Recheck Lipids in 4 months - then plan f/u with CVRR.      Relevant Medications   amLODipine (NORVASC) 2.5 MG tablet   Other Relevant Orders   Lipid panel (Completed)   Hepatic function panel (Completed)   Lipid panel   Hepatic function panel   Constipation by delayed colonic transit (Chronic)    Discussed several pharmacological and nonpharmacological methods of treating this.  Thankfully, no signs of melena or hematochezia.      Essential hypertension (Chronic)    Blood pressure looks pretty well controlled.  He should be on tolerate low-dose Toprol.  His pressure is not that low.  Was not able to tolerate higher dose of beta-blocker because of fatigue and bradycardia.      Relevant Medications   amLODipine (NORVASC) 2.5 MG tablet   Nocturnal leg cramps (Chronic)    Noted exacerbation of Sx after converting  to atorvastatin.  30 d statin holiday - reassess Sx after 30 d.  If Better - will change to rosuvastatin 20 mg + Zetia 10 mg; if not better, change to rosuvastatin 40 mg.      Relevant Orders   Lipid panel (Completed)   Hepatic function panel (Completed)   Lipid panel   Hepatic function panel       COVID-19 Education: The signs and symptoms of COVID-19 were discussed with the patient and how to seek care for testing (follow up with PCP or arrange E-visit).   The importance of social distancing was discussed today.  I spent a total of 22 minutes with the patient and chart review. >  50% of the time was spent in direct patient consultation.  Additional time spent with chart review (studies, outside notes, etc): 6 Total Time: 28 min  Current medicines are reviewed at length with the patient today.  (+/- concerns) n/a   Patient Instructions / Medication Changes & Studies & Tests Ordered   Patient Instructions  Medication Instructions:  Start taking Amlodipine 2.5 mf one tablet daily   Hold Atorvastatin  For 30 day s - if better  Will change to rosuvastatin 20 mg daily with zetia 10 mg daily ,   -if not better will start Rosuvastatin 40 mg  Daily - please call office to give [place] your progress  *If you need a refill on your cardiac medications before your next appointment, please call your pharmacy*  Lab Work:  lipid  Hepatic- now  And in 4 months  Repeat labs - lipid , hepatic  If you have labs (blood work) drawn today and your tests are completely  normal, you will receive your results only by: Marland Kitchen MyChart Message (if you have MyChart) OR . A paper copy in the mail If you have any lab test that is abnormal or we need to change your treatment, we will call you to review the results.  Testing/Procedures: Not needed  Follow-Up: At [facility], you and your health needs are our priority.  As part of our continuing mission to provide you with exceptional heart care, we have  created designated Provider Care Teams.  These Care Teams include your primary Cardiologist (physician) and Advanced Practice Providers (APPs -  Physician Assistants and Nurse Practitioners) who all work together to provide you with the care you need, when you need it.  Your next appointment:   8 month(s)  The format for your next appointment:   In Person  Provider:   Glenetta Hew, MD  Other Instructions  will need to do labs in 4 months then have an appointment with CVRR team ( pharmacist ) to discuss your cholesterol levels.   Studies Ordered:   Today's labs reviewed today.  Orders Placed This Encounter  Procedures  . Lipid panel  . Hepatic function panel  . Lipid panel  . Hepatic function panel  . EKG 12-Lead     Glenetta Hew, M.D., M.S. Interventional Cardiologist   Pager # [phone] Phone # [phone] [address] [place], [place] [address]   Thank you for choosing Heartcare at [facility]!!

## 2019-10-20 NOTE — Assessment & Plan Note (Signed)
Noted exacerbation of Sx after converting to atorvastatin.  30 d statin holiday - reassess Sx after 30 d.  If Better - will change to rosuvastatin 20 mg + Zetia 10 mg; if not better, change to rosuvastatin 40 mg.

## 2019-10-20 NOTE — Patient Instructions (Addendum)
Medication Instructions:  Start taking Amlodipine 2.5 mf one tablet daily   Hold Atorvastatin  For 30 day s - if better  Will change to rosuvastatin 20 mg daily with zetia 10 mg daily ,   -if not better will start Rosuvastatin 40 mg  Daily - please call office to give [place] your progress  *If you need a refill on your cardiac medications before your next appointment, please call your pharmacy*  Lab Work:  lipid  Hepatic- now  And in 4 months  Repeat labs - lipid , hepatic  If you have labs (blood work) drawn today and your tests are completely normal, you will receive your results only by: Marland Kitchen MyChart Message (if you have MyChart) OR . A paper copy in the mail If you have any lab test that is abnormal or we need to change your treatment, we will call you to review the results.  Testing/Procedures: Not needed  Follow-Up: At [facility], you and your health needs are our priority.  As part of our continuing mission to provide you with exceptional heart care, we have created designated Provider Care Teams.  These Care Teams include your primary Cardiologist (physician) and Advanced Practice Providers (APPs -  Physician Assistants and Nurse Practitioners) who all work together to provide you with the care you need, when you need it.  Your next appointment:   8 month(s)  The format for your next appointment:   In Person  Provider:   Glenetta Hew, MD  Other Instructions  will need to do labs in 4 months then have an appointment with CVRR team ( pharmacist ) to discuss your cholesterol levels.

## 2019-10-20 NOTE — Assessment & Plan Note (Addendum)
Not at goal as of yet - changed to atorvastatin - but now with cramps.  We check baseline levels of lipids today: We will allow for a 30 d statin holiday to assess as cause of cramping.  Pending improvement of Sx - will either change to rosuvastatin 20 mg + Zetia 10mg  (if Sx better), or rosuvastatin 40 mg if Sx not changed.  Recheck Lipids in 4 months - then plan f/u with CVRR.

## 2019-10-22 ENCOUNTER — Encounter: Payer: Self-pay | Admitting: Cardiology

## 2019-10-22 NOTE — Assessment & Plan Note (Signed)
Blood pressure looks pretty well controlled.  He should be on tolerate low-dose Toprol.  His pressure is not that low.  Was not able to tolerate higher dose of beta-blocker because of fatigue and bradycardia.

## 2019-10-22 NOTE — Assessment & Plan Note (Signed)
Distant history of PTCA only of the RCA back in 97.  Has had several follow-up Myoview evaluations done but none recently.  If his exertional chest burning symptoms were to worsen, would warrant repeat testing, but for now we will hold off. Barely able to tolerate the 12.5 of Toprol.  We will add 2.5 mg amlodipine. Is on atorvastatin at moderate dose and not noting significant cramping.  Planning statin holiday and likely converting to rosuvastatin. Is on aspirin for maintenance therapy.

## 2019-10-22 NOTE — Assessment & Plan Note (Signed)
He is intermittently had this noting of burning in his chest when he exerts himself, but this the first, cardiac this year.  Not with routine exertion.  Not really an activity.  Relatively stable.  We will continue medical management.    Plan will be to add amlodipine 2.5 mg as he is not able to tolerate any further dosing of beta-blocker.  Refill nitroglycerin.

## 2019-10-22 NOTE — Assessment & Plan Note (Signed)
Discussed several pharmacological and nonpharmacological methods of treating this.  Thankfully, no signs of melena or hematochezia.

## 2019-10-24 ENCOUNTER — Telehealth: Payer: Self-pay | Admitting: *Deleted

## 2019-10-24 MED ORDER — EZETIMIBE 10 MG PO TABS: 10.0000 mg | ORAL_TABLET | Freq: Every day | ORAL | 3 refills | Status: DC

## 2019-10-24 MED ORDER — ROSUVASTATIN CALCIUM 40 MG PO TABS: 40.0000 mg | ORAL_TABLET | Freq: Every day | ORAL | 3 refills | Status: DC

## 2019-10-24 NOTE — Telephone Encounter (Signed)
The patient has been notified of the result and verbalized understanding.  All questions (if any) were answered. Rosuvastatin 40 mg and Zetia 10 mg  On file at pharmacy. Raiford Simmonds, RN 10/24/2019 2:52 PM

## 2019-10-24 NOTE — Telephone Encounter (Signed)
-----   Message from Leonie Man, MD sent at 10/22/2019  5:33 PM EST ----- Unfortunately, her baseline lipid levels are not within to be.  This would indicate that we are not getting the back to the block with the atorvastatin that we wanted anyway.  Plan for now is to continue to hold atorvastatin for 1 month.  I then 1 to restart you at rosuvastatin plus Zetia depending on how your symptoms go.  If he did not notice any benefit from holding atorvastatin, would you 40 mg rosuvastatin +10 mg Zetia.  If you do note benefit from being off atorvastatin, then will start with 20 mg rosuvastatin +10 mg Zetia and continue with the plan to have a follow-up labs in 4 months and then in our lipid clinic.  Glenetta Hew, MD

## 2019-10-24 NOTE — Telephone Encounter (Signed)
Patient returned your call.

## 2019-10-24 NOTE — Telephone Encounter (Signed)
Left message to call back about lab results  

## 2019-10-31 ENCOUNTER — Telehealth: Payer: Self-pay | Admitting: Cardiology

## 2019-10-31 NOTE — Telephone Encounter (Signed)
Patient would like to speak to nurse about his medications he states he is not sure want medications he needs to be taking.

## 2019-10-31 NOTE — Telephone Encounter (Signed)
Called, discussed latest medications from last OV. Patient had all questions answered, no other questions.

## 2019-11-18 ENCOUNTER — Other Ambulatory Visit: Payer: Self-pay | Admitting: Cardiology

## 2020-03-19 NOTE — Progress Notes (Signed)
Patient ID: Omar [name]                 DOB: [birth year]                    MRN: [id number]     HPI: Omar [name] is a 64 y.o. male patient referred to lipid clinic by Dr. Ellyn Hack. PMH is significant for CAD, aortic valve sclerosis, HTN, angina, OSA, hx of STEMI (PCI RCA - 1997). At prior appt with Dr. Ellyn Hack on 10/22/2019, Dr. Ellyn Hack decided to hold atorvastatin since patient was complaining of myalgias for one month. If patient noticed benefit from holding atorvastatin, then he instructed patient he will change patient from atorvastatin to rosuvastatin 20 mg daily + Zetia 10 mg daily. If patient did not notice any benefit from holding atorvastatin then he instructed patient he would change patient from atorvastatin to rosuvastatin 40 mg daily + Zetia 10 mg daily. It appears prescription for rosuvastatin 40 mg daily and Zetia 10 mg daily were sent to pharmacy 10/24/2019. Patient contacted clinic on 10/31/2019 with questions regarding which medications he should be taking. Patient did not call after 11/23/2019 (1 month later) to confirm or deny if myalgias resolved with holding atorvastatin.   Patinet presents for initial appt. Patient reports he took a statin holiday for about 3 weeks. He was still experiencing cramps for that time period. He started rosuvastatin 40 mg daily at the end of the 3 weeks and is still experiencing cramps.  He states cramps happen infrequently (maybe 3 days in a row without cramps then 2 days with cramps).  Current Medications: rosuvastatin 40 mg daily and Zetia 10 mg daily Intolerances: atorvastatin 40 mg daily (myalgias); simvastatin 40 mg daily (LDL not at goal) Risk Factors: ASCVD (hx of STEMI (PCI RCA - 1997) angina), OSA, aortic valve sclerosis, HTN LDL goal: < 70 mg/dL  Diet: 2 meals and snacks throughout the day -Fried food/fast food: 3x per week -[place]ert[place]: eats a honeybun everyday -Alcohol: 1 beer per week  Exercise: golfs everyday  Family  History: "everybody" had cardiac conditions; brother (MI; age of onset: 21 years old); father (open heart surgery and all kinds of stuff)  Social History: no tobacco use; one alcoholic beverage per week  Labs: 10/20/2019 TC 168 TG 111 HDL 49 LDL 99; atorvastatin 40 mg daily 04/29/2019 TC 155 TG 113 HDL 48 LDL 84; simvastatin 40 mg daily 03/31/2018 TC 164 TG 139 HDL 45 LDL 91; simvastatin 40 mg daily  Past Medical History:  Diagnosis Date  . Anxiety 07-19-2003   Dr.Saaat Humphrey Rolls  . Aortic valve sclerosis January 2014   Noted on echocardiogram to have aortic sclerosis without stenosis. Ordered for murmur  . CAD S/P percutaneous coronary angioplasty 05/30/1996   single -vessel disease (RCA) Rx with POBA on P[facility]I protocol in june 1997; Re-Look Cath 12/1996: ~20-30% residual no stenosis in 1998 with good LV Function; Myoview 6/'09:  low risk scan ,post EF 58%, this was the fourth such scan in as many years  . Chronic low back pain    Has seen Dr. Marlou Sa  . Dyslipidemia, goal LDL below 70    On statin  . Hypertension   . Lumbosacral disc disease Feb,1 2011   ThIs information from CT done several years ago  . Obstructive sleep apnea 07-19-2003   breathing disorder services,Dr. Steward Drone. 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  . ST elevation myocardial infarction (STEMI) involving right coronary artery in recovery phase ([place]) 05/30/1996   100% occluded RCA -- PTCA-POBA (~20% residual) of RCA (part of P[facility]I Trial) ;; Normal EF By Echo in 2014    Current Outpatient Medications on File Prior to Visit  Medication Sig Dispense Refill  . amLODipine (NORVASC) 2.5 MG tablet Take 1 tablet (2.5 mg total) by mouth daily. 90 tablet 3  . aspirin 81 MG tablet Take 81 mg by mouth daily.    Marland Kitchen ezetimibe (ZETIA) 10 MG tablet Take 1 tablet (10 mg total) by mouth daily. 90 tablet 3  . metoprolol succinate (TOPROL-XL) 25 MG 24 hr tablet  TAKE 1/2 TABLET (12.5 MG TOTAL) BY MOUTH DAILY. 45 tablet 1  . rosuvastatin (CRESTOR) 40 MG tablet Take 1 tablet (40 mg total) by mouth daily. 90 tablet 3  . vitamin E 400 UNIT capsule Take 400 Units by mouth daily.     No current facility-administered medications on file prior to visit.    No Known Allergies  Assessment/Plan:  1. Hyperlipidemia - LDL goal < 70 mg/dL; therefore, patient's last LDL was not at goal however patient has been titrated to stronger HLD medication regimen since last lab draw (atorvastatin 40 mg daily --> rosuvastatin 40 mg daily + Zetia 10 mg daily). Will obtain updated lipid panel and LFTs. If at goal LDL then will continue rosuvastatin 40 mg daily and Zetia 10 mg daily. If not at goal will have to re-assess HLD medication therapy and add PCSK9 inhibitor or bempedoic acid. Patient verbalized understanding.   Thank you for involving pharmacy to assist in providing this patient's care.   Drexel Iha, PharmD PGY2 Ambulatory Care Pharmacy Resident

## 2020-03-20 ENCOUNTER — Ambulatory Visit: Payer: Medicare Other | Admitting: Cardiology

## 2020-03-20 ENCOUNTER — Other Ambulatory Visit: Payer: Self-pay

## 2020-03-20 ENCOUNTER — Ambulatory Visit (INDEPENDENT_AMBULATORY_CARE_PROVIDER_SITE_OTHER): Payer: Medicare Other | Admitting: Pharmacist

## 2020-03-20 VITALS — BP 120/78 | HR 60 | Temp 95.5°F

## 2020-03-20 DIAGNOSIS — I25119 Atherosclerotic heart disease of native coronary artery with unspecified angina pectoris: Secondary | ICD-10-CM | POA: Diagnosis not present

## 2020-03-20 LAB — LIPID PANEL
Chol/HDL Ratio: 2 ratio (ref 0.0–5.0)
Cholesterol, Total: 82 mg/dL — ABNORMAL LOW (ref 100–199)
HDL: 41 mg/dL (ref >39)
LDL Chol Calc (NIH): 26 mg/dL (ref 0–99)
Triglycerides: 70 mg/dL (ref 0–149)
VLDL Cholesterol Cal: 15 mg/dL (ref 5–40)

## 2020-03-20 NOTE — Patient Instructions (Addendum)
It was a pleasure seeing you in clinic today Mr. Omar [name]!  Today the plan is... 1. Get cholesterol labs today 2. A pharmacist will call you tomorrow with your results and discuss if any changes need to be made to your cholesterol medications   Please call the PharmD clinic at [phone] if you have any questions that you would like to speak with a pharmacist about Erasmo Downer, Raquel).

## 2020-03-21 ENCOUNTER — Encounter: Payer: Self-pay | Admitting: Cardiology

## 2020-03-21 LAB — HEPATIC FUNCTION PANEL
ALT: 16 IU/L (ref 0–44)
AST: 23 IU/L (ref 0–40)
Albumin: 4.8 g/dL (ref 3.8–4.8)
Alkaline Phosphatase: 100 IU/L (ref 39–117)
Bilirubin Total: 1.1 mg/dL (ref 0.0–1.2)
Bilirubin, Direct: 0.31 mg/dL (ref 0.00–0.40)
Total Protein: 6.9 g/dL (ref 6.0–8.5)

## 2020-06-14 ENCOUNTER — Other Ambulatory Visit: Payer: Self-pay | Admitting: Cardiology

## 2020-06-20 DIAGNOSIS — N453 Epididymo-orchitis: Secondary | ICD-10-CM | POA: Diagnosis not present

## 2020-06-26 DIAGNOSIS — N453 Epididymo-orchitis: Secondary | ICD-10-CM | POA: Diagnosis not present

## 2020-07-02 ENCOUNTER — Other Ambulatory Visit: Payer: Self-pay

## 2020-07-02 ENCOUNTER — Ambulatory Visit: Payer: Medicare Other | Admitting: Cardiology

## 2020-07-02 VITALS — BP 130/74 | HR 54 | Ht 68.0 in | Wt 173.3 lb

## 2020-07-02 DIAGNOSIS — R5383 Other fatigue: Secondary | ICD-10-CM | POA: Insufficient documentation

## 2020-07-02 DIAGNOSIS — E669 Obesity, unspecified: Secondary | ICD-10-CM

## 2020-07-02 DIAGNOSIS — I10 Essential (primary) hypertension: Secondary | ICD-10-CM | POA: Diagnosis not present

## 2020-07-02 DIAGNOSIS — E785 Hyperlipidemia, unspecified: Secondary | ICD-10-CM

## 2020-07-02 DIAGNOSIS — I25119 Atherosclerotic heart disease of native coronary artery with unspecified angina pectoris: Secondary | ICD-10-CM

## 2020-07-02 DIAGNOSIS — I208 Other forms of angina pectoris: Secondary | ICD-10-CM

## 2020-07-02 DIAGNOSIS — I252 Old myocardial infarction: Secondary | ICD-10-CM | POA: Diagnosis not present

## 2020-07-02 DIAGNOSIS — I2089 Other forms of angina pectoris: Secondary | ICD-10-CM

## 2020-07-02 MED ORDER — NITROGLYCERIN 0.4 MG SL SUBL
0.4000 mg | SUBLINGUAL_TABLET | SUBLINGUAL | 6 refills | Status: AC | PRN
Start: 2020-07-02 — End: ?

## 2020-07-02 MED ORDER — AMLODIPINE BESYLATE 5 MG PO TABS
5.0000 mg | ORAL_TABLET | Freq: Every day | ORAL | 3 refills | Status: DC
Start: 2020-07-02 — End: 2021-07-02

## 2020-07-02 NOTE — Patient Instructions (Addendum)
Medication Instructions:   Take metoprolol succinate (1/2 tablet) every other day for 1 week and then stop  Increase your amlodipine 2.5 mg to 2 tablets daily-I will give you a new prescription for 5 mg when you are due for refill.  New Rx: Amlodipine 5 mg p.o. daily, dispense #90 tab, 3 refill  We will also give a refill of as needed nitroglycerin to place on return for chest pain  Once you have completed the 1 week every other day of metoprolol succinate, you should only be taking the following medications: Amlodipine (total of 5 mg -> 2 of your existing tablets, and one of the new prescription tablets) once daily, aspirin 81 mg daily, ezetimibe 10 mg daily, rosuvastatin 40 mg daily, and vitamin E.  *If you need a refill on your cardiac medications before your next appointment, please call your pharmacy*   Lab Work: Lipids and CMP, CBC, TSH in October 2021   If you have labs (blood work) drawn today and your tests are completely normal, you will receive your results only by: Marland Kitchen MyChart Message (if you have MyChart) OR . A paper copy in the mail If you have any lab test that is abnormal or we need to change your treatment, we will call you to review the results.   Testing/Procedures:  None   Follow-Up: At [facility], you and your health needs are our priority.  As part of our continuing mission to provide you with exceptional heart care, we have created designated Provider Care Teams.  These Care Teams include your primary Cardiologist (physician) and Advanced Practice Providers (APPs -  Physician Assistants and Nurse Practitioners) who all work together to provide you with the care you need, when you need it.  We recommend signing up for the patient portal called "MyChart".  Sign up information is provided on this After Visit Summary.  MyChart is used to connect with patients for Virtual Visits (Telemedicine).  Patients are able to view lab/test results, encounter notes,  upcoming appointments, etc.  Non-urgent messages can be sent to your provider as well.   To learn more about what you can do with MyChart, go to [email or web address].    Your next appointment:   1 year(s) Aug 2022  The format for your next appointment:   In Person  Provider:   Glenetta Hew, MD   Other Instructions N/A

## 2020-07-02 NOTE — Progress Notes (Signed)
Primary Care Provider: Terald Sleeper, PA-C Cardiologist: Glenetta Hew, MD Electrophysiologist: None  Clinic Note: No chief complaint on file.  HPI:    Omar [name] is a 64 y.o. male with a PMH notable for Inf STEMI (RCA PCI -1997) who presents today for ~ 8 month f/u. He is former patient of Dr. Terance Ice.   May 30, 1996-inferior STEMI: PTCA of RCA as part of the PAMI trial (randomized to PTCA &not Stent).   As part of the trial he had a re-look cath the following year that showed minimal residual stenosis @ the PTCA site & has had several negative stress tests since (last one in 2009).  RAJIV PARLATO was last seen on 10/20/2019--> Still notes occasoinal "burning sensation in the chest with lots of activity" but not with routine exercise. (I.e. while chopping an entire load of firewood, or climbing up "out of a holllow" carrying hunting equipment. Also notes PM cramps.  I decided to stop atorvastatin to see if this would help his symptoms.  He was seen by Raquel Rodriguez-Guzman, RPH-CCP in [facility] lipid clinic.  He did not notice any significant improvement in cramps after 3 weeks of statin holiday.  He started rosuvastatin 40 mg, and is still experiencing progressive.  He milligrams not very randomly, can occur 3 days in a row and then go 2 days without. -->  Since it did not appear that the statins were the cause of his cramps, she continued with the rosuvastatin 40 mg and Zetia 10 mg.  Follow-up labs showed notably improved lipids.  Recent Hospitalizations: None  Was recently seen at [facility] for a symptoms of right testicular orchitis and epididymitis; treated with 1 g Rocephin along with Toradol and given Norco.  Reviewed  CV studies:    The following studies were reviewed today: (if available, images/films reviewed: From Epic Chart or Care Everywhere) . none  Interval History:   LAUREL SMELTZ returns here today for  43-month follow-up stating that he preoxygenated relatively well.  He still says his energy level is not "too good ".  He just does not feel he doing as much as he used to.  He says he feels sleepy and sluggish all day.  He still does plays golf for 5 days a week, and does yard work.  He just has to stop every now and then.  He says that if he pushes it too much with overexertion, he can still have the burning sensation in his chest and shortness of breath.  He says that when this happens he just stops and takes a rest and it goes away.  He denies any of these sensations with normal routine activity or exercise.  Able to walk holes of golf without difficulty, just feels tired.  Cramping seems to have improved, no real change with being on statin.  Not sure if he is taking just the rosuvastatin or taking combination of atorvastatin and rosuvastatin.  I did clarify for him to simply take one of the 2.  Cardiovascular Review of Symptoms (Summary): positive for - maybe a little DOE or chest burning with increased exertion - goes away with rest.  NOT with routine activity/exercise. negative for - edema, irregular heartbeat, orthopnea, palpitations, paroxysmal nocturnal dyspnea, rapid heart rate, shortness of breath or syncope/nmear syncope; TIA/amaurosis fugax; claudication.  The patient does not have symptoms concerning for COVID-19 infection (fever, chills, cough, or new shortness of breath).  The  patient is practicing social distancing & Masking.   COVID Vaccine -- April-May [facility])  REVIEWED OF SYSTEMS   Review of Systems  Constitutional: Positive for malaise/fatigue (Just does not feel he has a lot of energy.  Not a lot of get up and go.). Negative for weight loss.  HENT: Negative for nosebleeds.   Respiratory: Negative for shortness of breath (Only if he overexerts).   Gastrointestinal: Positive for heartburn (Off-and-on). Negative for abdominal pain, blood in stool and melena.    Genitourinary: Negative for hematuria.  Musculoskeletal: Positive for joint pain (Knees hurt little bit and get cold at night).  Neurological: Negative for dizziness, focal weakness and headaches.  Psychiatric/Behavioral: Positive for memory loss (Has a hard time memory lots of things.). The patient is not nervous/anxious.     I have reviewed and (if needed) personally updated the patient's problem list, medications, allergies, past medical and surgical history, social and family history.   PAST MEDICAL HISTORY   Past Medical History:  Diagnosis Date  . Anxiety 07-19-2003   Dr.Saaat Humphrey Rolls  . Aortic valve sclerosis January 2014   Noted on echocardiogram to have aortic sclerosis without stenosis. Ordered for murmur  . CAD S/P percutaneous coronary angioplasty 05/30/1996   single -vessel disease (RCA) Rx with POBA on PAMI protocol in june 1997; Re-Look Cath 12/1996: ~20-30% residual no stenosis in 1998 with good LV Function; Myoview 6/'09:  low risk scan ,post EF 58%, this was the fourth such scan in as many years  . Chronic low back pain    Has seen Dr. Marlou Sa  . Dyslipidemia, goal LDL below 70    On statin  . Hypertension   . Lumbosacral disc disease Feb,1 2011   ThIs information from CT done several years ago  . Obstructive sleep apnea 07-19-2003   breathing disorder services,Dr. Steward Drone. 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  . ST elevation myocardial infarction (STEMI) involving right coronary artery in recovery phase ([place]) 05/30/1996   100% occluded RCA -- PTCA-POBA (~20% residual) of RCA (part of PAMI Trial) ;; Normal EF By Echo in 2014    PAST SURGICAL HISTORY   Past Surgical History:  Procedure Laterality Date  . COLONOSCOPY N/A 10/27/2018   Procedure: COLONOSCOPY;  Surgeon: Rogene Houston, MD;  Location: [facility];  Service: Endoscopy;  Laterality: N/A;  1030  . HERNIA REPAIR  2005    remote laparscopic hernia repair by Dr.martin in may 2005  . LEFT HEART CATH AND CORONARY ANGIOGRAPHY  12-23-1996   ASHD h/o acute DMI with emergency PTCA- randomized to balloon PTCA 05-30-1996 reperfusion time 2 hrs 2 min.,single vessel coronary disease RCA  . NM MYOVIEW LTD  05/2008   Mild anterior defect consistent with chest wall attenuation. Normal EF and no ischemia, no infarction  . POLYPECTOMY  10/27/2018   Procedure: POLYPECTOMY;  Surgeon: Rogene Houston, MD;  Location: [facility];  Service: Endoscopy;;  colon  . TRANSTHORACIC ECHOCARDIOGRAM  January 2014   Mild concentric LVH. Normal systolic and diastolic function with EF 55-65%. Mild LA dilation. Aortic sclerosis without stenosis.    MEDICATIONS/ALLERGIES   Current Meds  Medication Sig  . aspirin 81 MG tablet Take 81 mg by mouth daily.  Marland Kitchen ezetimibe (ZETIA) 10 MG tablet Take 1 tablet (10 mg total) by mouth daily.  . rosuvastatin (CRESTOR) 40 MG tablet Take 1 tablet (40 mg total)  by mouth daily.  . vitamin E 400 UNIT capsule Take 400 Units by mouth daily.  . [DISCONTINUED] amLODipine (NORVASC) 2.5 MG tablet Take 1 tablet (2.5 mg total) by mouth daily.  . [DISCONTINUED] metoprolol succinate (TOPROL-XL) 25 MG 24 hr tablet TAKE 1/2 TABLET (12.5 MG TOTAL) BY MOUTH DAILY.    No Known Allergies    SOCIAL HISTORY/FAMILY HISTORY   Reviewed in Epic:  Pertinent findings:  Family History: "everybody" had cardiac conditions; brother (MI; age of onset: 15 years old); father (open heart surgery and all kinds of stuff)  OBJCTIVE -PE, EKG, labs   Wt Readings from Last 3 Encounters:  07/02/20 173 lb 4.8 oz (78.6 kg)  10/20/19 189 lb (85.7 kg)  10/11/19 188 lb 9.6 oz (85.5 kg)    Physical Exam: BP (!) 130/74   Pulse 54   Ht 5\' 8"  (1.727 m)   Wt 173 lb 4.8 oz (78.6 kg)   SpO2 97%   BMI 26.35 kg/m  Physical Exam Vitals reviewed.  Constitutional:      General: He is not in acute distress.    Appearance: Normal appearance.  He is not ill-appearing.  Neck:     Vascular: No carotid bruit, hepatojugular reflux or JVD.  Cardiovascular:     Rate and Rhythm: Normal rate and regular rhythm. Occasional extrasystoles are present.    Chest Wall: PMI is not displaced.     Pulses: Normal pulses.     Heart sounds: Heart sounds are distant. Murmur heard.  Harsh crescendo-decrescendo midsystolic murmur is present with a grade of 1/6 at the upper right sternal border radiating to the neck.  No friction rub. No gallop.   Pulmonary:     Effort: Pulmonary effort is normal. No respiratory distress.     Breath sounds: Normal breath sounds.  Musculoskeletal:        General: No swelling. Normal range of motion.     Cervical back: Normal range of motion.  Neurological:     General: No focal deficit present.     Mental Status: He is alert and oriented to person, place, and time.  Psychiatric:        Mood and Affect: Mood normal.        Behavior: Behavior normal.        Thought Content: Thought content normal.        Judgment: Judgment normal.     Adult ECG Report  Rate: 54 ;  Rhythm: sinus bradycardia and Normal axis, intervals and durations.;   Narrative Interpretation: Stable EKG, rate slower.  Recent Labs:   Lab Results  Component Value Date   CHOL 82 (L) 03/20/2020   HDL 41 03/20/2020   LDLCALC 26 03/20/2020   TRIG 70 03/20/2020   CHOLHDL 2.0 03/20/2020   Lab Results  Component Value Date   CREATININE 0.84 10/11/2019   BUN 17 10/11/2019   NA 143 10/11/2019   K 5.5 (H) 10/11/2019   CL 106 10/11/2019   CO2 23 10/11/2019   No results found for: TSH  ASSESSMENT/PLAN    Problem List Items Addressed This Visit    History of acute inferior wall MI (Chronic)    Distant history of anterior STEMI with PTCA of RCA.  Myoview showed no evidence of infarct.  Follow-up echo did not show any evidence of wall motion abnormalities.  Likely subendocardial MI.  Plan: Continue to monitor for signs symptoms of worsening  CAD/angina Aggressive risk factor modification with blood pressure lipid control.  Relevant Orders   EKG 12-Lead (Completed)   Lipid panel   Comprehensive metabolic panel   TSH   CBC   Coronary artery disease involving native coronary artery of native heart with angina pectoris ([place]) - Primary (Chronic)    Distant history of PCI.  Has had several negative Myoview stress test done but none recently.  He has his burning sensation off and on but it does not really seem to be with all kinds of exertion--does not sound like anginal equivalent..  He still playing golf etc.  With stopping beta-blocker because of fatigue, I will increase amlodipine to 5 mg daily for additional antianginal benefit. Maintenance aspirin The plan had been for him to convert from atorvastatin to rosuvastatin -> need to clarify that he has stopped atorvastatin.  He is also on Zetia.      Relevant Medications   amLODipine (NORVASC) 5 MG tablet   nitroGLYCERIN (NITROSTAT) 0.4 MG SL tablet   Other Relevant Orders   EKG 12-Lead (Completed)   Lipid panel   Comprehensive metabolic panel   TSH   CBC   Stable angina (HCC) (Chronic)    Try to tell if his chest burning symptom is anginal or not.  It seems relatively controlled.  Usually goes away without even having to consider taking nitroglycerin.  Plan: Titrate up amlodipine for additional antianginal effect and provide.  Nitroglycerin refill.      Relevant Medications   amLODipine (NORVASC) 5 MG tablet   nitroGLYCERIN (NITROSTAT) 0.4 MG SL tablet   Hyperlipidemia with target LDL less than 70 (Chronic)    Based on most recent lipids, well-controlled on rosuvastatin plus Zetia.  If he continues to be this well controlled, can probably back down on rosuvastatin to avoid potential side effects.      Relevant Medications   amLODipine (NORVASC) 5 MG tablet   nitroGLYCERIN (NITROSTAT) 0.4 MG SL tablet   Other Relevant Orders   Lipid panel   Comprehensive  metabolic panel   TSH   CBC   Obesity (BMI 30-39.9) (Chronic)   Relevant Orders   Lipid panel   Comprehensive metabolic panel   TSH   CBC   Essential hypertension (Chronic)    Blood pressure stable.  I am stopping the Toprol because of fatigue so we will increase amlodipine to 5 mg daily.      Relevant Medications   amLODipine (NORVASC) 5 MG tablet   nitroGLYCERIN (NITROSTAT) 0.4 MG SL tablet   Other Relevant Orders   Lipid panel   Comprehensive metabolic panel   TSH   CBC   Fatigue due to treatment    Hard to tell was really going on, but he is noticing it some fatigue.  About a month and I think we can stop the Toprol and simply increase amlodipine for additional blood pressure control.  He is on a relatively low dose of Toprol.  (If he is taking 2 different statin medications hopefully stopping 1 of those will also help)      Relevant Orders   EKG 12-Lead (Completed)   Lipid panel   Comprehensive metabolic panel   TSH   CBC       COVID-19 Education: The signs and symptoms of COVID-19 were discussed with the patient and how to seek care for testing (follow up with PCP or arrange E-visit).   The importance of social distancing and COVID-19 vaccination was discussed today.  I spent a total of 61minutes with the patient. >  50%  of the time was spent in direct patient consultation.  Additional time spent with chart review  / charting (studies, outside notes, etc): 6 Total Time: 24 min   Current medicines are reviewed at length with the patient today.  (+/- concerns) n/a  Notice: This dictation was prepared with Dragon dictation along with smaller phrase technology. Any transcriptional errors that result from this process are unintentional and may not be corrected upon review.  Patient Instructions / Medication Changes & Studies & Tests Ordered   Patient Instructions  Medication Instructions:   Take metoprolol succinate (1/2 tablet) every other day for 1 week and  then stop  Increase your amlodipine 2.5 mg to 2 tablets daily-I will give you a new prescription for 5 mg when you are due for refill.  New Rx: Amlodipine 5 mg p.o. daily, dispense #90 tab, 3 refill  We will also give a refill of as needed nitroglycerin to place on return for chest pain  Once you have completed the 1 week every other day of metoprolol succinate, you should only be taking the following medications: Amlodipine (total of 5 mg -> 2 of your existing tablets, and one of the new prescription tablets) once daily, aspirin 81 mg daily, ezetimibe 10 mg daily, rosuvastatin 40 mg daily, and vitamin E.  *If you need a refill on your cardiac medications before your next appointment, please call your pharmacy*   Lab Work: Lipids and CMP, CBC, TSH in October 2021   If you have labs (blood work) drawn today and your tests are completely normal, you will receive your results only by: Marland Kitchen MyChart Message (if you have MyChart) OR . A paper copy in the mail If you have any lab test that is abnormal or we need to change your treatment, we will call you to review the results.   Testing/Procedures:  None   Follow-Up: At [facility], you and your health needs are our priority.  As part of our continuing mission to provide you with exceptional heart care, we have created designated Provider Care Teams.  These Care Teams include your primary Cardiologist (physician) and Advanced Practice Providers (APPs -  Physician Assistants and Nurse Practitioners) who all work together to provide you with the care you need, when you need it.  We recommend signing up for the patient portal called "MyChart".  Sign up information is provided on this After Visit Summary.  MyChart is used to connect with patients for Virtual Visits (Telemedicine).  Patients are able to view lab/test results, encounter notes, upcoming appointments, etc.  Non-urgent messages can be sent to your provider as well.   To learn more  about what you can do with MyChart, go to [email or web address].    Your next appointment:   1 year(s) Aug 2022  The format for your next appointment:   In Person  Provider:   Glenetta Hew, MD   Other Instructions N/A     Studies Ordered:   Orders Placed This Encounter  Procedures  . Lipid panel  . Comprehensive metabolic panel  . TSH  . CBC  . EKG 12-Lead     Glenetta Hew, M.D., M.S. Interventional Cardiologist   Pager # [phone] Phone # [phone] [place], [place] [address]   Thank you for choosing Heartcare at [facility]!!

## 2020-07-05 ENCOUNTER — Encounter: Payer: Self-pay | Admitting: Cardiology

## 2020-07-05 NOTE — Assessment & Plan Note (Signed)
Based on most recent lipids, well-controlled on rosuvastatin plus Zetia.  If he continues to be this well controlled, can probably back down on rosuvastatin to avoid potential side effects.

## 2020-07-05 NOTE — Assessment & Plan Note (Addendum)
Distant history of PCI.  Has had several negative Myoview stress test done but none recently.  He has his burning sensation off and on but it does not really seem to be with all kinds of exertion--does not sound like anginal equivalent..  He still playing golf etc.  With stopping beta-blocker because of fatigue, I will increase amlodipine to 5 mg daily for additional antianginal benefit. Maintenance aspirin The plan had been for him to convert from atorvastatin to rosuvastatin -> need to clarify that he has stopped atorvastatin.  He is also on Zetia.

## 2020-07-05 NOTE — Assessment & Plan Note (Signed)
Blood pressure stable.  I am stopping the Toprol because of fatigue so we will increase amlodipine to 5 mg daily.

## 2020-07-05 NOTE — Assessment & Plan Note (Signed)
Distant history of anterior STEMI with PTCA of RCA.  Myoview showed no evidence of infarct.  Follow-up echo did not show any evidence of wall motion abnormalities.  Likely subendocardial MI.  Plan: Continue to monitor for signs symptoms of worsening CAD/angina Aggressive risk factor modification with blood pressure lipid control.

## 2020-07-05 NOTE — Assessment & Plan Note (Signed)
Hard to tell was really going on, but he is noticing it some fatigue.  About a month and I think we can stop the Toprol and simply increase amlodipine for additional blood pressure control.  He is on a relatively low dose of Toprol.  (If he is taking 2 different statin medications hopefully stopping 1 of those will also help)

## 2020-07-05 NOTE — Assessment & Plan Note (Signed)
Try to tell if his chest burning symptom is anginal or not.  It seems relatively controlled.  Usually goes away without even having to consider taking nitroglycerin.  Plan: Titrate up amlodipine for additional antianginal effect and provide.  Nitroglycerin refill.

## 2020-10-13 ENCOUNTER — Other Ambulatory Visit: Payer: Self-pay | Admitting: Cardiology

## 2020-10-18 DIAGNOSIS — H10013 Acute follicular conjunctivitis, bilateral: Secondary | ICD-10-CM | POA: Diagnosis not present

## 2021-04-16 ENCOUNTER — Ambulatory Visit (INDEPENDENT_AMBULATORY_CARE_PROVIDER_SITE_OTHER): Payer: Medicare Other

## 2021-04-16 ENCOUNTER — Encounter: Payer: Self-pay | Admitting: Nurse Practitioner

## 2021-04-16 ENCOUNTER — Ambulatory Visit (INDEPENDENT_AMBULATORY_CARE_PROVIDER_SITE_OTHER): Payer: Medicare Other | Admitting: Nurse Practitioner

## 2021-04-16 ENCOUNTER — Other Ambulatory Visit: Payer: Self-pay

## 2021-04-16 VITALS — BP 125/76 | HR 62 | Temp 98.1°F | Resp 20 | Ht 68.0 in | Wt 179.0 lb

## 2021-04-16 DIAGNOSIS — R0781 Pleurodynia: Secondary | ICD-10-CM

## 2021-04-16 DIAGNOSIS — S20212A Contusion of left front wall of thorax, initial encounter: Secondary | ICD-10-CM

## 2021-04-16 MED ORDER — TRAMADOL HCL 50 MG PO TABS: 50.0000 mg | ORAL_TABLET | Freq: Three times a day (TID) | ORAL | 0 refills | Status: AC | PRN

## 2021-04-16 NOTE — Progress Notes (Signed)
   Subjective:    Patient ID: Omar [name], male    DOB: January 22, 1956, 65 y.o.   MRN: [id number]   Chief Complaint: cracked rib  HPI Patient comes in accompanied by his wife. He is c/o left rib pain. He fell in his house and landed on his left flank. It did not know him out. He has had had left flank pain since incidence.   Review of Systems  Constitutional: Negative.  Negative for diaphoresis.  HENT: Negative.   Eyes: Negative for pain.  Respiratory: Negative for cough and shortness of breath.   Cardiovascular: Negative for chest pain, palpitations and leg swelling.  Gastrointestinal: Negative for abdominal pain.  Endocrine: Negative for polydipsia.  Skin: Negative for rash.  Neurological: Negative for dizziness, weakness and headaches.  Hematological: Does not bruise/bleed easily.  All other systems reviewed and are negative.      Objective:   Physical Exam Vitals and nursing note reviewed.  Constitutional:      Appearance: Normal appearance.  Cardiovascular:     Rate and Rhythm: Normal rate and regular rhythm.     Heart sounds: Normal heart sounds.  Pulmonary:     Effort: Pulmonary effort is normal.     Breath sounds: Normal breath sounds.  Skin:    General: Skin is warm.  Neurological:     General: No focal deficit present.     Mental Status: He is alert and oriented to person, place, and time.  Psychiatric:        Mood and Affect: Mood normal.        Behavior: Behavior normal.    BP 125/76   Pulse 62   Temp 98.1 F (36.7 C) (Temporal)   Resp 20   Ht 5\' 8"  (1.727 m)   Wt 179 lb (81.2 kg)   SpO2 98%   BMI 27.22 kg/m   Chest xray no obvious rib fracture on left-Preliminary reading by Ronnald Collum, FNP  [facility]       Assessment & Plan:  [name] in today with chief complaint of Rib Injury   1. Rib pain - DG Ribs Unilateral W/Chest Left  2. Contusion of rib on left side, initial encounter Splint area and cough and deep breathe every 2  hours Force fluids Meds ordered this encounter  Medications  . traMADol (ULTRAM) 50 MG tablet    Sig: Take 1 tablet (50 mg total) by mouth every 8 (eight) hours as needed for up to 5 days.    Dispense:  15 tablet    Refill:  0    Order Specific Question:   Supervising Provider    Answer:   Caryl Pina A [[id number]]       The above assessment and management plan was discussed with the patient. The patient verbalized understanding of and has agreed to the management plan. Patient is aware to call the clinic if symptoms persist or worsen. Patient is aware when to return to the clinic for a follow-up visit. Patient educated on when it is appropriate to go to the emergency department.   Mary-Margaret Hassell Done, FNP

## 2021-06-27 ENCOUNTER — Other Ambulatory Visit: Payer: Self-pay

## 2021-06-27 ENCOUNTER — Encounter: Payer: Self-pay | Admitting: Family Medicine

## 2021-06-27 ENCOUNTER — Telehealth: Payer: Self-pay | Admitting: Family Medicine

## 2021-06-27 ENCOUNTER — Ambulatory Visit (INDEPENDENT_AMBULATORY_CARE_PROVIDER_SITE_OTHER): Payer: Medicare Other | Admitting: Family Medicine

## 2021-06-27 VITALS — BP 117/71 | HR 61 | Temp 97.8°F | Ht 68.0 in | Wt 180.8 lb

## 2021-06-27 DIAGNOSIS — S00412A Abrasion of left ear, initial encounter: Secondary | ICD-10-CM | POA: Diagnosis not present

## 2021-06-27 DIAGNOSIS — H833X3 Noise effects on inner ear, bilateral: Secondary | ICD-10-CM | POA: Diagnosis not present

## 2021-06-27 DIAGNOSIS — H9313 Tinnitus, bilateral: Secondary | ICD-10-CM | POA: Diagnosis not present

## 2021-06-27 NOTE — Progress Notes (Signed)
No chief complaint on file.   HPI  Patient presents today for decreased hearing.  He has had no injury.  No symptoms of pain.  However he has intermittent ringing in the ears and notes that he does not hear as well as he used to.  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  He never used hearing protection.  He wants his ears checked today for possible cerumen impaction.  He does clean them with some drops that bubble out that ear canal from time to time he used and just last night.  He also cleans his ears with Q-tips.  PMH: Smoking status noted ROS: Per HPI  Objective: BP 117/71   Pulse 61   Temp 97.8 F (36.6 C)   Ht '5\' 8"'$  (1.727 m)   Wt 180 lb 12.8 oz (82 kg)   SpO2 99%   BMI 27.49 kg/m  Gen: NAD, alert, cooperative with exam HEENT: NCAT, EOMI, PERRL hearing is grossly intact for conversation.  TMs are essentially normal with some old scarring noted.  The left ear canal has an excoriation with some fresh blood at the 4 o'clock position adjacent to the tympanic membrane.  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 Tinnitus of both ears   3. Abrasion of left ear canal, initial encounter     Omar [name] was advised to see cardiology evaluation for his tinnitus and hearing loss.  He declines to follow through on this at this time.  For the abrasion he needs to discontinue the use of Q-tips.  The drops to bubble out of the ears should be fine.    Follow up as needed.  Claretta Fraise, MD

## 2021-07-02 ENCOUNTER — Other Ambulatory Visit: Payer: Self-pay | Admitting: Cardiology

## 2021-07-10 ENCOUNTER — Ambulatory Visit (INDEPENDENT_AMBULATORY_CARE_PROVIDER_SITE_OTHER): Payer: Medicare Other

## 2021-07-10 VITALS — Ht 68.0 in | Wt 180.0 lb

## 2021-07-10 DIAGNOSIS — Z0000 Encounter for general adult medical examination without abnormal findings: Secondary | ICD-10-CM | POA: Diagnosis not present

## 2021-07-10 NOTE — Progress Notes (Signed)
Subjective:   Omar [name] is a 65 y.o. male who presents for an Initial Medicare Annual Wellness Visit.  Virtual Visit via Telephone Note  I connected with  Omar [name] on 07/10/21 at  3:30 PM EDT by telephone and verified that I am speaking with the correct person using two identifiers.  Location: Patient: Home Provider: [facility] Persons participating in the virtual visit: patient/Nurse Health Advisor   I discussed the limitations, risks, security and privacy concerns of performing an evaluation and management service by telephone and the availability of in person appointments. The patient expressed understanding and agreed to proceed.  Interactive audio and video telecommunications were attempted between this nurse and patient, however failed, due to patient having technical difficulties OR patient did not have access to video capability.  We continued and completed visit with audio only.  Some vital signs may be absent or patient reported.   Omar [name] E Omar Lofton, LPN   Review of Systems     Cardiac Risk Factors include: advanced age (>71mn, >>30women);hypertension;dyslipidemia;sedentary lifestyle;male gender;family history of premature cardiovascular disease;Other (see comment), Risk factor comments: CAD, hx of MI, untreated OSA     Objective:    Today's Vitals   07/10/21 1534  Weight: 180 lb (81.6 kg)  Height: '5\' 8"'$  (1.727 m)   Body mass index is 27.37 kg/m.  Advanced Directives 07/10/2021 10/27/2018  Does Patient Have a Medical Advance Directive? No No  Would patient like information on creating a medical advance directive? No - Patient declined Yes (MAU/Ambulatory/Procedural Areas - Information given)    Current Medications (verified) Outpatient Encounter Medications as of 07/10/2021  Medication Sig   amLODipine (NORVASC) 5 MG tablet TAKE 1 TABLET BY MOUTH EVERY DAY   aspirin 81 MG tablet Take 81 mg by mouth daily.   ezetimibe (ZETIA) 10 MG tablet TAKE 1  TABLET BY MOUTH EVERY DAY   nitroGLYCERIN (NITROSTAT) 0.4 MG SL tablet Place 1 tablet (0.4 mg total) under the tongue every 5 (five) minutes as needed for chest pain.   rosuvastatin (CRESTOR) 40 MG tablet TAKE 1 TABLET BY MOUTH EVERY DAY   vitamin E 400 UNIT capsule Take 400 Units by mouth daily.   No facility-administered encounter medications on file as of 07/10/2021.    Allergies (verified) Patient has no known allergies.   History: Past Medical History:  Diagnosis Date   Anxiety 07-19-2003   Dr.Saaat KHumphrey Rolls  Aortic valve sclerosis January 2014   Noted on echocardiogram to have aortic sclerosis without stenosis. Ordered for murmur   CAD S/P percutaneous coronary angioplasty 05/30/1996   single -vessel disease (RCA) Rx with POBA on PAMI protocol in june 1997; Re-Look Cath 12/1996: ~20-30% residual no stenosis in 1998 with good LV Function; Myoview 6/'09:  low risk scan ,post EF 58%, this was the fourth such scan in as many years   Chronic low back pain    Has seen Dr. DMarlou Sa  Dyslipidemia, goal LDL below 70    On statin   Hypertension    Lumbosacral disc disease Feb,1 2011   ThIs information from CT done several years ago   Obstructive sleep apnea 07-19-2003   breathing disorder services,Dr. 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   ST elevation myocardial infarction (STEMI) involving right coronary artery in recovery phase ([place] 05/30/1996   100% occluded RCA -- PTCA-POBA (~20% residual) of RCA (part of  PAMI Trial) ;; Normal EF By Echo in 2014   Past Surgical History:  Procedure Laterality Date   COLONOSCOPY N/A 10/27/2018   Procedure: COLONOSCOPY;  Surgeon: Rogene Houston, MD;  [facility];  Service: Endoscopy;  Laterality: N/A;  [place]  2005   remote laparscopic hernia repair by Dr.martin in may 2005   LEFT HEART CATH AND CORONARY ANGIOGRAPHY  12-23-1996    ASHD h/o acute DMI with emergency PTCA- randomized to balloon PTCA 05-30-1996 reperfusion time 2 hrs 2 min.,single vessel coronary disease RCA   NM MYOVIEW LTD  05/2008   Mild anterior defect consistent with chest wall attenuation. Normal EF and no ischemia, no infarction   POLYPECTOMY  10/27/2018   Procedure: POLYPECTOMY;  Surgeon: Rogene Houston, MD;  [facility];  Service: Endoscopy;;  colon   TRANSTHORACIC ECHOCARDIOGRAM  January 2014   Mild concentric LVH. Normal systolic and diastolic function with EF 55-65%. Mild LA dilation. Aortic sclerosis without stenosis.   Family History  Problem Relation Age of Onset   Healthy Mother    Other Father        Unknown details   Colon cancer Neg Hx    Social History   Socioeconomic History   Marital status: Married    Spouse name: Omar [name]   Number of children: 2   Years of education: Not on file   Highest education level: Not on file  Occupational History   Not on file  Tobacco Use   Smoking status: Former    Types: Cigarettes    Quit date: 12/02/1995    Years since quitting: 25.6   Smokeless tobacco: Former  Substance and Sexual Activity   Alcohol use: Yes    Comment: occasionally   Drug use: Not Currently    Types: Marijuana    Comment: in the 1960's   Sexual activity: Not on file  Other Topics Concern   Not on file  Social History Narrative   Married father of 2 with 3 children. He quit smoking in 1997.   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 He does drink not alcohol.   Social Determinants of Health   Financial Resource Strain: Low Risk    Difficulty of Paying Living Expenses: Not hard at all  Food Insecurity: No Food Insecurity   Worried About Charity fundraiser in the Last Year: Never true   [place] in the Last Year: Never true  Transportation Needs: No Transportation Needs   Lack of Transportation (Medical): No   Lack of Transportation (Non-Medical): No  Physical Activity: Inactive   Days of  Exercise per Week: 0 days   Minutes of Exercise per Session: 0 min  Stress: No Stress Concern Present   Feeling of Stress : Not at all  Social Connections: Moderately Integrated   Frequency of Communication with Friends and Family: More than three times a week   Frequency of Social Gatherings with Friends and Family: More than three times a week   Attends Religious Services: 1 to 4 times per year   Active Member of [employer] or Organizations: No   Attends Archivist Meetings: Never   Marital Status: Married    Tobacco Counseling Counseling given: Not Answered   Clinical Intake:  Pre-visit preparation completed: Yes  Pain : No/denies pain     BMI - recorded: 27.37 Nutritional Status: BMI 25 -29 Overweight Nutritional Risks: None Diabetes: No  How often do  you need to have someone help you when you read instructions, pamphlets, or other written materials from your doctor or pharmacy?: 1 - Never  Diabetic? No  Interpreter Needed?: No  Information entered by :: Long Brimage, LPN   Activities of Daily Living In your present state of health, do you have any difficulty performing the following activities: 07/10/2021  Hearing? Y  Comment c/o moderate hearing difficulties - declines hearing aids  Vision? N  Difficulty concentrating or making decisions? Y  Comment very forgetful  Walking or climbing stairs? N  Dressing or bathing? N  Doing errands, shopping? N  Preparing Food and eating ? N  Using the Toilet? N  In the past six months, have you accidently leaked urine? N  Do you have problems with loss of bowel control? N  Comment severe constipation - used to take Linzess, but can't afford it - made appt to discuss  Managing your Medications? N  Managing your Finances? N  Housekeeping or managing your Housekeeping? N  Some recent data might be hidden    Patient Care Team: Claretta Fraise, MD as PCP - General (Family Medicine) Leonie Man, MD as PCP -  Cardiology (Cardiology)  Indicate any recent Medical Services you may have received from other than [facility] providers in the past year (date may be approximate).     Assessment:   This is a routine wellness examination for [place].  Hearing/Vision screen Hearing Screening - Comments:: Denies hearing difficulties  Vision Screening - Comments:: Denies vision difficulties - up to date with annual eye exams with Dr Marin Comment in [place]  Dietary issues and exercise activities discussed: Current Exercise Habits: The patient does not participate in regular exercise at present, Exercise limited by: cardiac condition(s);orthopedic condition(s)   Goals Addressed             This Visit's Progress    Exercise 3x per week (30 min per time)         Depression Screen PHQ 2/9 Scores 07/10/2021 06/27/2021 10/11/2019  PHQ - 2 Score 0 0 0    Fall Risk Fall Risk  07/10/2021 06/27/2021 04/16/2021 10/11/2019  Falls in the past year? 0 0 1 0  Number falls in past yr: 0 - 0 -  Injury with Fall? 0 - 1 -  Risk for fall due to : Orthopedic patient;History of fall(s) - History of fall(s) -  Follow up Falls prevention discussed - Education provided -    FALL RISK PREVENTION PERTAINING TO THE HOME:  Any stairs in or around the home? Yes  If so, are there any without handrails? No  Home free of loose throw rugs in walkways, pet beds, electrical cords, etc? Yes  Adequate lighting in your home to reduce risk of falls? Yes   ASSISTIVE DEVICES UTILIZED TO PREVENT FALLS:  Life alert? No  Use of a cane, walker or w/c? No  Grab bars in the bathroom? No  Shower chair or bench in shower? No  Elevated toilet seat or a handicapped toilet? No   TIMED UP AND GO:  Was the test performed? No . Telephonic visit  Cognitive Function:     6CIT Screen 07/10/2021  What Year? 0 points  What month? 0 points  What time? 0 points  Count back from 20 0 points  Months in reverse 0 points  Repeat phrase 2 points  Total  Score 2    Immunizations Immunization History  Administered Date(s) Administered   Fluad Quad(high Dose 65+)  10/11/2019   Influenza, High Dose Seasonal PF 01/09/2021   Influenza,inj,Quad PF,6+ Mos 08/13/2018   Moderna Sars-Covid-2 Vaccination 01/02/2020, 01/30/2020    TDAP status: Due, Education has been provided regarding the importance of this vaccine. Advised may receive this vaccine at local pharmacy or Health Dept. Aware to provide a copy of the vaccination record if obtained from local pharmacy or Health Dept. Verbalized acceptance and understanding.  Flu Vaccine status: Up to date  Pneumococcal vaccine status: Due, Education has been provided regarding the importance of this vaccine. Advised may receive this vaccine at local pharmacy or Health Dept. Aware to provide a copy of the vaccination record if obtained from local pharmacy or Health Dept. Verbalized acceptance and understanding.  Covid-19 vaccine status: Completed vaccines  Qualifies for Shingles Vaccine? Yes   Zostavax completed No   Shingrix Completed?: No.    Education has been provided regarding the importance of this vaccine. Patient has been advised to call insurance company to determine out of pocket expense if they have not yet received this vaccine. Advised may also receive vaccine at local pharmacy or Health Dept. Verbalized acceptance and understanding.  Screening Tests Health Maintenance  Topic Date Due   Hepatitis C Screening  Never done   TETANUS/TDAP  Never done   Zoster Vaccines- Shingrix (1 of 2) Never done   PNA vac Low Risk Adult (1 of 2 - PCV13) Never done   COVID-19 Vaccine (3 - Booster for Moderna series) 07/01/2020   INFLUENZA VACCINE  07/01/2021   COLONOSCOPY (Pts 45-66yr Insurance coverage will need to be confirmed)  10/28/2023   HPV VACCINES  Aged Out    Health Maintenance  Health Maintenance Due  Topic Date Due   Hepatitis C Screening  Never done   TETANUS/TDAP  Never done   Zoster  Vaccines- Shingrix (1 of 2) Never done   PNA vac Low Risk Adult (1 of 2 - PCV13) Never done   COVID-19 Vaccine (3 - Booster for Moderna series) 07/01/2020   INFLUENZA VACCINE  07/01/2021    Colorectal cancer screening: Type of screening: Colonoscopy. Completed 10/27/2018. Repeat every 5 years  Lung Cancer Screening: (Low Dose CT Chest recommended if Age 67-80years, 30 pack-year currently smoking OR have quit w/in 15years.) does not qualify.  Additional Screening:  Hepatitis C Screening: does qualify; Due  Vision Screening: Recommended annual ophthalmology exams for early detection of glaucoma and other disorders of the eye. Is the patient up to date with their annual eye exam?  Yes  Who is the provider or what is the name of the office in which the patient attends annual eye exams? YAnthony SarIf pt is not established with a provider, would they like to be referred to a provider to establish care? No .   Dental Screening: Recommended annual dental exams for proper oral hygiene  Community Resource Referral / Chronic Care Management: CRR required this visit?  No   CCM required this visit?  No      Plan:     I have personally reviewed and noted the following in the patient's chart:   Medical and social history Use of alcohol, tobacco or illicit drugs  Current medications and supplements including opioid prescriptions. Patient is not currently taking opioid prescriptions. Functional ability and status Nutritional status Physical activity Advanced directives List of other physicians Hospitalizations, surgeries, and ER visits in previous 12 months Vitals Screenings to include cognitive, depression, and falls Referrals and appointments  In addition, I have  reviewed and discussed with patient certain preventive protocols, quality metrics, and best practice recommendations. A written personalized care plan for preventive services as well as general preventive health recommendations  were provided to patient.     Sandrea Hammond, LPN   [id number]   Nurse Notes: None

## 2021-07-10 NOTE — Patient Instructions (Signed)
Mr. Omar [name] , Thank you for taking time to come for your Medicare Wellness Visit. I appreciate your ongoing commitment to your health goals. Please review the following plan we discussed and let me know if I can assist you in the future.   Screening recommendations/referrals: Colonoscopy: Done 10/27/2014 - Repeat in 5 years Recommended yearly ophthalmology/optometry visit for glaucoma screening and checkup Recommended yearly dental visit for hygiene and checkup  Vaccinations: Influenza vaccine: Done 01/09/2021 - Repeat in fall Pneumococcal vaccine: Due Tdap vaccine: Due. Every 10 years Shingles vaccine: Due. Shingrix discussed. Please contact your pharmacy for coverage information.     Covid-19: Done 01/2020 & 01/2020 - due for booster  Advanced directives: Please bring a copy of your health care power of attorney and living will to the office to be added to your chart at your convenience.   Conditions/risks identified: Aim for 30 minutes of exercise or brisk walking each day, drink 6-8 glasses of water and eat lots of fruits and vegetables.   Next appointment: Follow up in one year for your annual wellness visit.   Preventive Care 67 Years and Older, Male  Preventive care refers to lifestyle choices and visits with your health care provider that can promote health and wellness. What does preventive care include? A yearly physical exam. This is also called an annual well check. Dental exams once or twice a year. Routine eye exams. Ask your health care provider how often you should have your eyes checked. Personal lifestyle choices, including: Daily care of your teeth and gums. Regular physical activity. Eating a healthy diet. Avoiding tobacco and drug use. Limiting alcohol use. Practicing safe sex. Taking low doses of aspirin every day. Taking vitamin and mineral supplements as recommended by your health care provider. What happens during an annual well check? The services and  screenings done by your health care provider during your annual well check will depend on your age, overall health, lifestyle risk factors, and family history of disease. Counseling  Your health care provider may ask you questions about your: Alcohol use. Tobacco use. Drug use. Emotional well-being. Home and relationship well-being. Sexual activity. Eating habits. History of falls. Memory and ability to understand (cognition). Work and work Statistician. Screening  You may have the following tests or measurements: Height, weight, and BMI. Blood pressure. Lipid and cholesterol levels. These may be checked every 5 years, or more frequently if you are over 65 years old. Skin check. Lung cancer screening. You may have this screening every year starting at age 23 if you have a 30-pack-year history of smoking and currently smoke or have quit within the past 15 years. Fecal occult blood test (FOBT) of the stool. You may have this test every year starting at age 92. Flexible sigmoidoscopy or colonoscopy. You may have a sigmoidoscopy every 5 years or a colonoscopy every 10 years starting at age 17. Prostate cancer screening. Recommendations will vary depending on your family history and other risks. Hepatitis C blood test. Hepatitis B blood test. Sexually transmitted disease (STD) testing. Diabetes screening. This is done by checking your blood sugar (glucose) after you have not eaten for a while (fasting). You may have this done every 1-3 years. Abdominal aortic aneurysm (AAA) screening. You may need this if you are a current or former smoker. Osteoporosis. You may be screened starting at age 14 if you are at high risk. Talk with your health care provider about your test results, treatment options, and if necessary, the need for  more tests. Vaccines  Your health care provider may recommend certain vaccines, such as: Influenza vaccine. This is recommended every year. Tetanus, diphtheria, and  acellular pertussis (Tdap, Td) vaccine. You may need a Td booster every 10 years. Zoster vaccine. You may need this after age 36. Pneumococcal 13-valent conjugate (PCV13) vaccine. One dose is recommended after age 25. Pneumococcal polysaccharide (PPSV23) vaccine. One dose is recommended after age 62. Talk to your health care provider about which screenings and vaccines you need and how often you need them. This information is not intended to replace advice given to you by your health care provider. Make sure you discuss any questions you have with your health care provider. Document Released: 12/14/2015 Document Revised: 08/06/2016 Document Reviewed: 09/18/2015 Elsevier Interactive Patient Education  2017 [place] Prevention in the Home Falls can cause injuries. They can happen to people of all ages. There are many things you can do to make your home safe and to help prevent falls. What can I do on the outside of my home? Regularly fix the edges of walkways and driveways and fix any cracks. Remove anything that might make you trip as you walk through a door, such as a raised step or threshold. Trim any bushes or trees on the path to your home. Use bright outdoor lighting. Clear any walking paths of anything that might make someone trip, such as rocks or tools. Regularly check to see if handrails are loose or broken. Make sure that both sides of any steps have handrails. Any raised decks and porches should have guardrails on the edges. Have any leaves, snow, or ice cleared regularly. Use sand or salt on walking paths during winter. Clean up any spills in your garage right away. This includes oil or grease spills. What can I do in the bathroom? Use night lights. Install grab bars by the toilet and in the tub and shower. Do not use towel bars as grab bars. Use non-skid mats or decals in the tub or shower. If you need to sit down in the shower, use a plastic, non-slip stool. Keep  the floor dry. Clean up any water that spills on the floor as soon as it happens. Remove soap buildup in the tub or shower regularly. Attach bath mats securely with double-sided non-slip rug tape. Do not have throw rugs and other things on the floor that can make you trip. What can I do in the bedroom? Use night lights. Make sure that you have a light by your bed that is easy to reach. Do not use any sheets or blankets that are too big for your bed. They should not hang down onto the floor. Have a firm chair that has side arms. You can use this for support while you get dressed. Do not have throw rugs and other things on the floor that can make you trip. What can I do in the kitchen? Clean up any spills right away. Avoid walking on wet floors. Keep items that you use a lot in easy-to-reach places. If you need to reach something above you, use a strong step stool that has a grab bar. Keep electrical cords out of the way. Do not use floor polish or wax that makes floors slippery. If you must use wax, use non-skid floor wax. Do not have throw rugs and other things on the floor that can make you trip. What can I do with my stairs? Do not leave any items on the stairs. Make  sure that there are handrails on both sides of the stairs and use them. Fix handrails that are broken or loose. Make sure that handrails are as long as the stairways. Check any carpeting to make sure that it is firmly attached to the stairs. Fix any carpet that is loose or worn. Avoid having throw rugs at the top or bottom of the stairs. If you do have throw rugs, attach them to the floor with carpet tape. Make sure that you have a light switch at the top of the stairs and the bottom of the stairs. If you do not have them, ask someone to add them for you. What else can I do to help prevent falls? Wear shoes that: Do not have high heels. Have rubber bottoms. Are comfortable and fit you well. Are closed at the toe. Do not  wear sandals. If you use a stepladder: Make sure that it is fully opened. Do not climb a closed stepladder. Make sure that both sides of the stepladder are locked into place. Ask someone to hold it for you, if possible. Clearly mark and make sure that you can see: Any grab bars or handrails. First and last steps. Where the edge of each step is. Use tools that help you move around (mobility aids) if they are needed. These include: Canes. Walkers. Scooters. Crutches. Turn on the lights when you go into a dark area. Replace any light bulbs as soon as they burn out. Set up your furniture so you have a clear path. Avoid moving your furniture around. If any of your floors are uneven, fix them. If there are any pets around you, be aware of where they are. Review your medicines with your doctor. Some medicines can make you feel dizzy. This can increase your chance of falling. Ask your doctor what other things that you can do to help prevent falls. This information is not intended to replace advice given to you by your health care provider. Make sure you discuss any questions you have with your health care provider. Document Released: 09/13/2009 Document Revised: 04/24/2016 Document Reviewed: 12/22/2014 Elsevier Interactive Patient Education  2017 [employer].

## 2021-07-23 ENCOUNTER — Encounter: Payer: Self-pay | Admitting: Family Medicine

## 2021-07-23 ENCOUNTER — Other Ambulatory Visit: Payer: Self-pay

## 2021-07-23 ENCOUNTER — Ambulatory Visit (INDEPENDENT_AMBULATORY_CARE_PROVIDER_SITE_OTHER): Payer: Medicare Other | Admitting: Family Medicine

## 2021-07-23 VITALS — BP 129/76 | HR 64 | Temp 97.3°F | Ht 68.0 in | Wt 181.4 lb

## 2021-07-23 DIAGNOSIS — K581 Irritable bowel syndrome with constipation: Secondary | ICD-10-CM | POA: Diagnosis not present

## 2021-07-23 DIAGNOSIS — K5901 Slow transit constipation: Secondary | ICD-10-CM

## 2021-07-23 MED ORDER — LINACLOTIDE 290 MCG PO CAPS: 290.0000 ug | ORAL_CAPSULE | Freq: Every day | ORAL | 2 refills | Status: DC

## 2021-07-23 NOTE — Progress Notes (Signed)
Subjective:  Patient ID: Omar [name], male    DOB: 08-Jun-1956  Age: 65 y.o. MRN: [id number]  CC: Irritable Bowel Syndrome   HPI [name] presents for ongoing problems with variable bowel.  He has constipation.  The leg cramping and pain and have a bowel movement about once a week.  Frequently than to do that he requires MiraLAX and/or others.  Frequently he finally did have an bowel movement that was basically explode he would have 4-5 bowel movements back to back and he would have to spend most of that day in the bathroom.  He used Linzess in the past with some success and would like to try that again.  He used it until his insurance raised co-pay on it and he was unable to afford it.  That was a couple of years ago.  He is hoping that with time his insurance will be covering it better for him.  His pain is moderately severe and he has a great deal of gas as well.  No nausea vomiting or diarrhea.  Appetite is good.  Depression screen [facility] 2/9 07/23/2021 07/10/2021 06/27/2021  Decreased Interest 0 0 0  Down, Depressed, Hopeless 0 0 0  PHQ - 2 Score 0 0 0    History Eleanor has a past medical history of Anxiety (07-19-2003), Aortic valve sclerosis (January 2014), CAD S/P percutaneous coronary angioplasty (05/30/1996), Chronic low back pain, Dyslipidemia, goal LDL below 70, Hypertension, Lumbosacral disc disease (Feb,1 2011), Obstructive sleep apnea (07-19-2003), and ST elevation myocardial infarction (STEMI) involving right coronary artery in recovery phase ([place]) (05/30/1996).   He has a past surgical history that includes Hernia repair (2005); NM MYOVIEW LTD (05/2008); transthoracic echocardiogram (January 2014); Colonoscopy (N/A, 10/27/2018); polypectomy (10/27/2018); and LEFT HEART CATH AND CORONARY ANGIOGRAPHY (12-23-1996).   His family history includes Healthy in his mother; Other in his father.He reports that he quit smoking about 25 years ago. His smoking use included cigarettes.  He has quit using smokeless tobacco. He reports current alcohol use. He reports that he does not currently use drugs after having used the following drugs: Marijuana.    ROS Review of Systems  Constitutional:  Negative for fever.  Respiratory:  Negative for shortness of breath.   Cardiovascular:  Negative for chest pain.  Gastrointestinal:  Positive for abdominal distention, abdominal pain and constipation. Negative for anal bleeding and blood in stool.  Musculoskeletal:  Negative for arthralgias.  Skin:  Negative for rash.   Objective:  BP 129/76   Pulse 64   Temp (!) 97.3 F (36.3 C)   Ht '5\' 8"'$  (1.727 m)   Wt 181 lb 6.4 oz (82.3 kg)   SpO2 98%   BMI 27.58 kg/m   BP Readings from Last 3 Encounters:  07/23/21 129/76  06/27/21 117/71  04/16/21 125/76    Wt Readings from Last 3 Encounters:  07/23/21 181 lb 6.4 oz (82.3 kg)  07/10/21 180 lb (81.6 kg)  06/27/21 180 lb 12.8 oz (82 kg)     Physical Exam Vitals reviewed.  Constitutional:      Appearance: He is well-developed.  HENT:     Head: Normocephalic and atraumatic.     Right Ear: External ear normal.     Left Ear: External ear normal.     Mouth/Throat:     Pharynx: No oropharyngeal exudate or posterior oropharyngeal erythema.  Eyes:     Pupils: Pupils are equal, round, and reactive to light.  Cardiovascular:  Rate and Rhythm: Normal rate and regular rhythm.     Heart sounds: No murmur heard. Pulmonary:     Effort: No respiratory distress.     Breath sounds: Normal breath sounds.  Abdominal:     General: There is distension.     Tenderness: There is abdominal tenderness.  Musculoskeletal:     Cervical back: Normal range of motion and neck supple.  Neurological:     Mental Status: He is alert and oriented to person, place, and time.      Assessment & Plan:   Jaymere was seen today for irritable bowel syndrome.  Diagnoses and all orders for this visit:  Constipation by delayed colonic  transit  Irritable bowel syndrome with constipation  Other orders -     linaclotide (LINZESS) 290 MCG CAPS capsule; Take 1 capsule (290 mcg total) by mouth daily. To regulate bowels      I am having [name] start on linaclotide. I am also having him maintain his aspirin, vitamin E, nitroGLYCERIN, rosuvastatin, ezetimibe, and amLODipine.  Allergies as of 07/23/2021   No Known Allergies      Medication List        Accurate as of July 23, 2021  5:36 PM. If you have any questions, ask your nurse or doctor.          amLODipine 5 MG tablet Commonly known as: NORVASC TAKE 1 TABLET BY MOUTH EVERY DAY   aspirin 81 MG tablet Take 81 mg by mouth daily.   ezetimibe 10 MG tablet Commonly known as: ZETIA TAKE 1 TABLET BY MOUTH EVERY DAY   linaclotide 290 MCG Caps capsule Commonly known as: Linzess Take 1 capsule (290 mcg total) by mouth daily. To regulate bowels Started by: Claretta Fraise, MD   nitroGLYCERIN 0.4 MG SL tablet Commonly known as: NITROSTAT Place 1 tablet (0.4 mg total) under the tongue every 5 (five) minutes as needed for chest pain.   rosuvastatin 40 MG tablet Commonly known as: CRESTOR TAKE 1 TABLET BY MOUTH EVERY DAY   vitamin E 180 MG (400 UNITS) capsule Take 400 Units by mouth daily.         Follow-up: Return in about 6 months (around 01/23/2022), or if symptoms worsen or fail to improve.  Claretta Fraise, M.D.

## 2021-07-24 ENCOUNTER — Telehealth: Payer: Self-pay | Admitting: Family Medicine

## 2021-07-24 ENCOUNTER — Other Ambulatory Visit: Payer: Self-pay | Admitting: Family Medicine

## 2021-07-24 DIAGNOSIS — K5901 Slow transit constipation: Secondary | ICD-10-CM

## 2021-07-24 MED ORDER — LUBIPROSTONE 24 MCG PO CAPS: 24.0000 ug | ORAL_CAPSULE | Freq: Two times a day (BID) | ORAL | 0 refills | Status: DC

## 2021-07-24 NOTE — Telephone Encounter (Signed)
I sent in Omar [name] for him instead.

## 2021-07-24 NOTE — Telephone Encounter (Signed)
Wife aware

## 2021-09-02 ENCOUNTER — Ambulatory Visit: Payer: Medicare Other | Admitting: Cardiology

## 2021-09-10 ENCOUNTER — Ambulatory Visit: Payer: Medicare Other | Admitting: Cardiology

## 2021-09-10 ENCOUNTER — Other Ambulatory Visit: Payer: Self-pay

## 2021-09-10 VITALS — BP 125/80 | HR 66 | Ht 69.0 in | Wt 181.4 lb

## 2021-09-10 DIAGNOSIS — I252 Old myocardial infarction: Secondary | ICD-10-CM

## 2021-09-10 DIAGNOSIS — R5383 Other fatigue: Secondary | ICD-10-CM | POA: Diagnosis not present

## 2021-09-10 DIAGNOSIS — I10 Essential (primary) hypertension: Secondary | ICD-10-CM | POA: Diagnosis not present

## 2021-09-10 DIAGNOSIS — I25119 Atherosclerotic heart disease of native coronary artery with unspecified angina pectoris: Secondary | ICD-10-CM | POA: Diagnosis not present

## 2021-09-10 DIAGNOSIS — I358 Other nonrheumatic aortic valve disorders: Secondary | ICD-10-CM | POA: Diagnosis not present

## 2021-09-10 DIAGNOSIS — I208 Other forms of angina pectoris: Secondary | ICD-10-CM

## 2021-09-10 DIAGNOSIS — E785 Hyperlipidemia, unspecified: Secondary | ICD-10-CM | POA: Diagnosis not present

## 2021-09-10 NOTE — Patient Instructions (Addendum)
Medication Instructions:   No changes     Lab Work: next 1 to 2 weeks  LIPID CMP CBC HGBA1c    fasting      call [facility]    to see if you ca do labs there. If you have labs (blood work) drawn today and your tests are completely normal, you will receive your results only by: [place] (if you have MyChart) OR A paper copy in the mail If you have any lab test that is abnormal or we need to change your treatment, we will call you to review the results.   Testing/Procedures: NOT NEEDED   Follow-Up: At [facility], you and your health needs are our priority.  As part of our continuing mission to provide you with exceptional heart care, we have created designated Provider Care Teams.  These Care Teams include your primary Cardiologist (physician) and Advanced Practice Providers (APPs -  Physician Assistants and Nurse Practitioners) who all work together to provide you with the care you need, when you need it.  We recommend signing up for the patient portal called "MyChart".  Sign up information is provided on this After Visit Summary.  MyChart is used to connect with patients for Virtual Visits (Telemedicine).  Patients are able to view lab/test results, encounter notes, upcoming appointments, etc.  Non-urgent messages can be sent to your provider as well.   To learn more about what you can do with MyChart, go to [email or web address].    Your next appointment:   6 month(s)  The format for your next appointment:   In Person  Provider:   You will see one of the following Advanced Practice Providers on your designated Care Team:   Rosaria Ferries, PA-C Caron Presume, PA-C Jory Sims, DNP, ANP  Then, Glenetta Hew, MD will plan to see you again in 12 month(s).

## 2021-09-10 NOTE — Progress Notes (Signed)
Primary Care Provider: Claretta Fraise, MD Cardiologist: Glenetta Hew, MD Electrophysiologist: None  Clinic Note: Chief Complaint  Patient presents with   Follow-up    Annual.  Doing okay.  No real change.  Still has fatigue   Coronary Artery Disease    At most class I stable angina.  Rarely has to exert to feel symptoms.    ===================================  ASSESSMENT/PLAN   Problem List Items Addressed This Visit       Cardiology Problems   Coronary artery disease involving native coronary artery of native heart with angina pectoris ([name]) - Primary (Chronic)    Pretty much single-vessel disease with occluded RCA at the time of his MI 25 years ago.  He had RCA PTCA/probe only as part of the PAMI trial.  Multiple follow-up studies showed that the RCA was patent.  He probably has microvascular disease causing some angina.  Intolerant to beta-blocker.  Feels much better off of the beta-blocker.  Energy level has improved.  Plan: Continue maintenance dose aspirin (which can be held for procedures or surgeries), stable dose of amlodipine although we do have room to titrate further, and current dose of rosuvastatin.  Unless symptoms worsen, I would not pursue further stress test evaluation.      Relevant Orders   EKG 12-Lead (Completed)   Lipid panel (Completed)   Comprehensive metabolic panel (Completed)   Hemoglobin A1c (Completed)   CBC (Completed)   Stable angina (HCC) (Chronic)    Class I stable angina.  He only notes that if he is walking briskly up hills or carrying something.  Not with routine activity.  He is not as active as he once was so was difficult to really tell.  He is able to do yard work etc. without difficulty.  He is on amlodipine 5 mg which seems to be keeping his angina at [place].  He did better off of beta-blocker which was causing fatigue.  Nitrates caused headache.  Try to avoid the cost of Ranexa.  At this point, would not look into further  ischemic evaluation as he is quite stable.      Relevant Orders   EKG 12-Lead (Completed)   Lipid panel (Completed)   Comprehensive metabolic panel (Completed)   Hemoglobin A1c (Completed)   CBC (Completed)   Hyperlipidemia with target LDL less than 70 (Chronic)    Per new guidelines, target LDL should be less than 55.  He is due for lab check as has not been checked in over a year.  Last LDL was 26.  He is on rosuvastatin 40 mg.   --> Labs were checked 2 days after the visit, and are reviewed below.  LDL still excellent at 31.  For now continue current dose of rosuvastatin.  Tolerating well without any myalgias..        Relevant Orders   Lipid panel (Completed)   Comprehensive metabolic panel (Completed)   Hemoglobin A1c (Completed)   CBC (Completed)   Essential hypertension (Chronic)    Well-controlled blood pressure.  We stopped Toprol because of fatigue and continued on amlodipine 5 mg which is maintaining adequate control of his angina.      Relevant Orders   Lipid panel (Completed)   Comprehensive metabolic panel (Completed)   Hemoglobin A1c (Completed)   CBC (Completed)   Aortic valve sclerosis (Chronic)    Soft systolic murmur.  Nothing concerning.  If murmur gets louder, would recheck.  Otherwise we will continue to monitor  Other   History of acute inferior wall MI (Chronic)    Very distant history of MI with pill but only to the RCA as part of the PAMI trial. - June 1997.  Patient not relook cath in January 1998. Normal EF on echo with no significant regional wall motion normality.  No evidence of infarct on Myoview.      Relevant Orders   EKG 12-Lead (Completed)   Lipid panel (Completed)   Comprehensive metabolic panel (Completed)   Hemoglobin A1c (Completed)   CBC (Completed)   Fatigue due to treatment    Still has some fatigue, but seems to be better since we stopped the beta-blocker.  No change in angina with the increased dose of amlodipine  continuing to stabilize symptoms.      Relevant Orders   Lipid panel (Completed)   Comprehensive metabolic panel (Completed)   Hemoglobin A1c (Completed)   CBC (Completed)    ===================================  HPI:    Omar [name] is a 65 y.o. male with a PMH notable for CAD with CRFs of HTN HLD.  He presents today for annual follow-up.  He is a former patient of Dr. Terance Ice and was enrolled in the PAMI trial in June of 1997. May 30, 1996 - Inf STEMI => RCA occluded  - PTCA/POBA (NOT STENT) Re-look Cath Jan 1998-minimal residual stenosis at the PTCA site. Multiple negative stress test.  Most recently in 2009. Has had mild class I stable angina for years.  Omar [name] was last seen on 07/02/2020: Mobility complained about was that his energy level was not back good.  Not as much energy he used to have.  Sluggish during the day.  Still plays golf 5 days a week and was doing yard work.  Does notice this stop every now and then more frequently.  Reports some soft overexertion he can get the burning sensation in his chest that is consistent with angina.  He goes away when he stops.  Does not take nitroglycerin.  We have done a challenge off of statin to see if it helped his cramping and it made no difference.  He therefore went back to statin. -> Weaned off of metoprolol and increased amlodipine to 5 mg.   Recent Hospitalizations: None  Reviewed  CV studies:    The following studies were reviewed today: (if available, images/films reviewed: From Epic Chart or Care Everywhere) None:  Interval History:   Omar [name] returns for annual follow-up.  He was doing the same.  He is playing golf most days but he usually now rides the cart from hold to hold more frequently and tries to do more walking around putting green but he just feels like he wants to go out walking more because has not as much energy.  Despite having low energy level, he continues to stay  active he tries to go walking in the neighborhood which goes up and down hill and he may get little bit short of breath, but only has chest tightness if he really overexerts.  This really only happens if he does not slow down and continues to push at a fast pace up a hill.  He actually does not notice was walking down.  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  He does has to stop every now and then.  He denies any symptoms of.  Does not have any issues with get up and go just has less energy  doing things when he actually starts.  He notes occasional flip-flopping sensation is in his chest that last more than no more than 2 or 3 seconds.  Nothing sustained and nothing associated with any other symptoms.  Pretty much stable from a cardiac standpoint.  CV Review of Symptoms (Summary) Cardiovascular ROS: positive for - dyspnea on exertion, irregular heartbeat, palpitations, and -exercise intolerance with class I stable angina/burning sensation in his chest with vigorous exertion. negative for - edema, orthopnea, paroxysmal nocturnal dyspnea, rapid heart rate, shortness of breath, or lightheadedness or dizziness, syncope/near syncope or TIA/amaurosis fugax, claudication  REVIEWED OF SYSTEMS   Review of Systems  Constitutional:  Positive for malaise/fatigue (He complains about having low energy level.  Just not like it used to be.  Still has desire to do things and makes himself do it.). Negative for weight loss.  HENT:  Negative for ear discharge and nosebleeds.   Respiratory:  Positive for shortness of breath (With vigorous exertion). Negative for cough and wheezing.   Cardiovascular:        Per HPI  Gastrointestinal:  Positive for constipation (Intermittent constipation followed by frequent bowel movements the next day.) and heartburn. Negative for blood in stool and melena.  Genitourinary:  Negative for hematuria.  Musculoskeletal:  Positive for joint pain (Knees).  Negative for myalgias (Just occasional cramping).  Neurological:  Negative for dizziness and focal weakness.  Psychiatric/Behavioral:  Positive for memory loss.    I have reviewed and (if needed) personally updated the patient's problem list, medications, allergies, past medical and surgical history, social and family history.   PAST MEDICAL HISTORY   Past Medical History:  Diagnosis Date   Anxiety 07-19-2003   Dr.Saaat [facility]   Aortic valve sclerosis January 2014   Noted on echocardiogram to have aortic sclerosis without stenosis. Ordered for murmur   CAD S/P percutaneous coronary angioplasty 05/30/1996   single -vessel disease (RCA) Rx with POBA on PAMI protocol in june 1997; Re-Look Cath 12/1996: ~20-30% residual no stenosis in 1998 with good LV Function; Myoview 6/'09:  low risk scan ,post EF 58%, this was the fourth such scan in as many years   Chronic low back pain    Has seen Dr. Marlou Sa   Dyslipidemia, goal LDL below 70    On statin   Hypertension    Lumbosacral disc disease Feb,1 2011   ThIs information from CT done several years ago   Obstructive sleep apnea 07-19-2003   breathing disorder services,Dr. Steward Drone. 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   ST elevation myocardial infarction (STEMI) involving right coronary artery in recovery phase ([place]) 05/30/1996   100% occluded RCA -- PTCA-POBA (~20% residual) of RCA (part of PAMI Trial) ;; Normal EF By Echo in 2014    PAST SURGICAL HISTORY   Past Surgical History:  Procedure Laterality Date   COLONOSCOPY N/A 10/27/2018   Procedure: COLONOSCOPY;  Surgeon: Rogene [place], MD;  Location: [facility];  Service: Endoscopy;  Laterality: N/A;  [place]  2005   remote laparscopic hernia repair by Dr.martin in may 2005   LEFT HEART CATH AND CORONARY ANGIOGRAPHY  12-23-1996   ASHD h/o acute DMI with emergency PTCA- randomized to balloon  PTCA 05-30-1996 reperfusion time 2 hrs 2 min.,single vessel coronary disease RCA   NM MYOVIEW LTD  05/2008   Mild anterior defect consistent with chest wall attenuation.  Normal EF and no ischemia, no infarction   POLYPECTOMY  10/27/2018   Procedure: POLYPECTOMY;  Surgeon: Rogene [place], MD;  Location: [facility];  Service: Endoscopy;;  colon   TRANSTHORACIC ECHOCARDIOGRAM  January 2014   Mild concentric LVH. Normal systolic and diastolic function with EF 55-65%. Mild LA dilation. Aortic sclerosis without stenosis.    Immunization History  Administered Date(s) Administered   Fluad Quad(high Dose 65+) 10/11/2019   Influenza, High Dose Seasonal PF 01/09/2021   Influenza,inj,Quad PF,6+ Mos 08/13/2018   Moderna Sars-Covid-2 Vaccination 01/02/2020, 01/30/2020    MEDICATIONS/ALLERGIES   Enzo Montgomery current Meds  Medication Sig   amLODipine (NORVASC) 5 MG tablet TAKE 1 TABLET BY MOUTH EVERY DAY   aspirin 81 MG tablet Take 81 mg by mouth daily.   ezetimibe (ZETIA) 10 MG tablet TAKE 1 TABLET BY MOUTH EVERY DAY   lubiprostone (AMITIZA) 24 MCG capsule Take 1 capsule (24 mcg total) by mouth 2 (two) times daily with a meal.   nitroGLYCERIN (NITROSTAT) 0.4 MG SL tablet Place 1 tablet (0.4 mg total) under the tongue every 5 (five) minutes as needed for chest pain.   rosuvastatin (CRESTOR) 40 MG tablet TAKE 1 TABLET BY MOUTH EVERY DAY   vitamin E 400 UNIT capsule Take 400 Units by mouth daily.    No Known Allergies  SOCIAL HISTORY/FAMILY HISTORY   Reviewed in Epic:  Pertinent findings:  Social History   Tobacco Use   Smoking status: Former    Types: Cigarettes    Quit date: 12/02/1995    Years since quitting: 25.8   Smokeless tobacco: Former  Substance Use Topics   Alcohol use: Yes    Comment: occasionally   Drug use: Not Currently    Types: Marijuana    Comment: in the 1960's   Social History   Social History Narrative   Married father of 2 with 3 children. He quit smoking in  1997.   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 He does drink not alcohol.    OBJCTIVE -PE, EKG, labs   Wt Readings from Last 3 Encounters:  09/10/21 181 lb 6.4 oz (82.3 kg)  07/23/21 181 lb 6.4 oz (82.3 kg)  07/10/21 180 lb (81.6 kg)    Physical Exam: BP 125/80   Pulse 66   Ht 5\' 9"  (1.753 m)   Wt 181 lb 6.4 oz (82.3 kg)   SpO2 98%   BMI 26.79 kg/m  Physical Exam Vitals reviewed.  Constitutional:      General: He is not in acute distress.    Appearance: Normal appearance. He is normal weight. He is not ill-appearing or toxic-appearing.  HENT:     Head: Normocephalic and atraumatic.  Neck:     Vascular: No carotid bruit or JVD.  Cardiovascular:     Rate and Rhythm: Normal rate and regular rhythm. No extrasystoles are present.    Chest Wall: PMI is not displaced.     Pulses: Normal pulses.     Heart sounds: S1 normal and S2 normal. Heart sounds are distant. Murmur heard.  Harsh crescendo-decrescendo early systolic murmur is present with a grade of 1/6 at the upper right sternal border.    No friction rub. No gallop.  Pulmonary:     Effort: Pulmonary effort is normal. No respiratory distress.     Breath sounds: Normal breath sounds. No wheezing, rhonchi or rales.  Musculoskeletal:        General: No swelling. Normal range of motion.  Cervical back: Normal range of motion and neck supple.  Skin:    General: Skin is warm and dry.  Neurological:     General: No focal deficit present.     Mental Status: He is alert and oriented to person, place, and time.     Gait: Gait abnormal.  Psychiatric:        Mood and Affect: Mood normal.        Behavior: Behavior normal.        Thought Content: Thought content normal.        Judgment: Judgment normal.     Adult ECG Report  Rate: 66 ;  Rhythm: normal sinus rhythm and normal axis, intervals and durations. ;   Narrative Interpretation: Stable EKG  Recent Labs: Due for labs (checked on 09/12/2021 -- reviewed in Addendum  below)   No results found for: TSH  ==================================================  COVID-19 Education: The signs and symptoms of COVID-19 were discussed with the patient and how to seek care for testing (follow up with PCP or arrange E-visit).    I spent a total of 24 minutes with the patient spent in direct patient consultation.  Additional time spent with chart review  / charting (studies, outside notes, etc): 22 min Total Time: 46 min  Current medicines are reviewed at length with the patient today.  (+/- concerns) n/a  This visit occurred during the SARS-CoV-2 public health emergency.  Safety protocols were in place, including screening questions prior to the visit, additional usage of staff PPE, and extensive cleaning of exam room while observing appropriate contact time as indicated for disinfecting solutions.  Notice: This dictation was prepared with Dragon dictation along with smart phrase technology. Any transcriptional errors that result from this process are unintentional and may not be corrected upon review.  Patient Instructions / Medication Changes & Studies & Tests Ordered   Patient Instructions  Medication Instructions:   No changes     Lab Work: next 1 to 2 weeks  LIPID CMP CBC HGBA1c    fasting      call [facility]    to see if you ca do labs there. If you have labs (blood work) drawn today and your tests are completely normal, you will receive your results only by: [facility] (if you have MyChart) OR A paper copy in the mail If you have any lab test that is abnormal or we need to change your treatment, we will call you to review the results.   Testing/Procedures: NOT NEEDED   Follow-Up: At [facility], you and your health needs are our priority.  As part of our continuing mission to provide you with exceptional heart care, we have created designated Provider Care Teams.  These Care Teams include your primary Cardiologist (physician)  and Advanced Practice Providers (APPs -  Physician Assistants and Nurse Practitioners) who all work together to provide you with the care you need, when you need it.  We recommend signing up for the patient portal called "MyChart".  Sign up information is provided on this After Visit Summary.  MyChart is used to connect with patients for Virtual Visits (Telemedicine).  Patients are able to view lab/test results, encounter notes, upcoming appointments, etc.  Non-urgent messages can be sent to your provider as well.   To learn more about what you can do with MyChart, go to [email or web address].    Your next appointment:   6 month(s)  The format for your next appointment:   In Person  Provider:   You will see one of the following Advanced Practice Providers on your designated Care Team:   Rosaria Ferries, PA-C Caron Presume, PA-C Jory Sims, DNP, ANP  Then, Glenetta Hew, MD will plan to see you again in 12 month(s).   Studies Ordered:   Orders Placed This Encounter  Procedures   Lipid panel   Comprehensive metabolic panel   Hemoglobin A1c   CBC   EKG 12-Lead    ADDENDUM: Lab Results - Lipids = Excellent. A1c - stable but up a bit (was 5.8) Lab Results  Component Value Date   CHOL 94 (L) 09/12/2021   HDL 44 09/12/2021   LDLCALC 31 09/12/2021   TRIG 97 09/12/2021   CHOLHDL 2.1 09/12/2021    Lab Results  Component Value Date   CREATININE 0.94 09/12/2021   BUN 12 09/12/2021   NA 142 09/12/2021   K 5.3 (H) 09/12/2021   CL 106 09/12/2021   CO2 25 09/12/2021   CBC Latest Ref Rng & Units 09/12/2021 10/11/2019  WBC 3.4 - 10.8 x10E3/uL 7.1 6.4  Hemoglobin 13.0 - 17.7 g/dL 14.7 14.8  Hematocrit 37.5 - 51.0 % 42.0 43.3  Platelets 150 - 450 x10E3/uL 245 235    Lab Results  Component Value Date   HGBA1C 6.0 (H) 09/12/2021     Glenetta Hew, M.D., M.S. Interventional Cardiologist   Pager # [phone] Phone # [phone] [address] [place], [place] [address]   Thank you for choosing Heartcare at [facility]!!

## 2021-09-12 ENCOUNTER — Other Ambulatory Visit: Payer: Medicare Other

## 2021-09-12 ENCOUNTER — Other Ambulatory Visit: Payer: Self-pay

## 2021-09-12 DIAGNOSIS — I208 Other forms of angina pectoris: Secondary | ICD-10-CM | POA: Diagnosis not present

## 2021-09-12 DIAGNOSIS — R5383 Other fatigue: Secondary | ICD-10-CM | POA: Diagnosis not present

## 2021-09-12 DIAGNOSIS — I252 Old myocardial infarction: Secondary | ICD-10-CM | POA: Diagnosis not present

## 2021-09-12 DIAGNOSIS — I25119 Atherosclerotic heart disease of native coronary artery with unspecified angina pectoris: Secondary | ICD-10-CM | POA: Diagnosis not present

## 2021-09-12 DIAGNOSIS — E785 Hyperlipidemia, unspecified: Secondary | ICD-10-CM | POA: Diagnosis not present

## 2021-09-12 DIAGNOSIS — I10 Essential (primary) hypertension: Secondary | ICD-10-CM | POA: Diagnosis not present

## 2021-09-13 LAB — HEMOGLOBIN A1C
Est. average glucose Bld gHb Est-mCnc: 126 mg/dL
Hgb A1c MFr Bld: 6 % — ABNORMAL HIGH (ref 4.8–5.6)

## 2021-09-13 LAB — COMPREHENSIVE METABOLIC PANEL
ALT: 12 IU/L (ref 0–44)
AST: 20 IU/L (ref 0–40)
Albumin/Globulin Ratio: 1.9 (ref 1.2–2.2)
Albumin: 4.5 g/dL (ref 3.8–4.8)
Alkaline Phosphatase: 102 IU/L (ref 44–121)
BUN/Creatinine Ratio: 13 (ref 10–24)
BUN: 12 mg/dL (ref 8–27)
Bilirubin Total: 0.9 mg/dL (ref 0.0–1.2)
CO2: 25 mmol/L (ref 20–29)
Calcium: 9.6 mg/dL (ref 8.6–10.2)
Chloride: 106 mmol/L (ref 96–106)
Creatinine, Ser: 0.94 mg/dL (ref 0.76–1.27)
Globulin, Total: 2.4 g/dL (ref 1.5–4.5)
Glucose: 97 mg/dL (ref 70–99)
Potassium: 5.3 mmol/L — ABNORMAL HIGH (ref 3.5–5.2)
Sodium: 142 mmol/L (ref 134–144)
Total Protein: 6.9 g/dL (ref 6.0–8.5)
eGFR: 89 mL/min/{1.73_m2} (ref >59)

## 2021-09-13 LAB — CBC
Hematocrit: 42 % (ref 37.5–51.0)
Hemoglobin: 14.7 g/dL (ref 13.0–17.7)
MCH: 31.5 pg (ref 26.6–33.0)
MCHC: 35 g/dL (ref 31.5–35.7)
MCV: 90 fL (ref 79–97)
Platelets: 245 10*3/uL (ref 150–450)
RBC: 4.67 x10E6/uL (ref 4.14–5.80)
RDW: 12.4 % (ref 11.6–15.4)
WBC: 7.1 10*3/uL (ref 3.4–10.8)

## 2021-09-13 LAB — LIPID PANEL
Chol/HDL Ratio: 2.1 ratio (ref 0.0–5.0)
Cholesterol, Total: 94 mg/dL — ABNORMAL LOW (ref 100–199)
HDL: 44 mg/dL (ref >39)
LDL Chol Calc (NIH): 31 mg/dL (ref 0–99)
Triglycerides: 97 mg/dL (ref 0–149)
VLDL Cholesterol Cal: 19 mg/dL (ref 5–40)

## 2021-09-18 ENCOUNTER — Telehealth: Payer: Self-pay | Admitting: *Deleted

## 2021-09-18 MED ORDER — HYDROCHLOROTHIAZIDE 12.5 MG PO CAPS: 12.5000 mg | ORAL_CAPSULE | Freq: Every day | ORAL | 3 refills | Status: DC

## 2021-09-18 NOTE — Telephone Encounter (Signed)
The patient has been notified of the result and verbalized understanding.  All questions (if any) were answered.  Patient states  his blood pressure  ranges is in the 120's  ( (127/?.,125 /?)  he would like to know if he has to take  the Hctz 12.5 mg . Rn informed patient will discuss with Dr Ellyn Hack and contact him back . Rn discuss with Dr Ellyn Hack and relayed message to patient . Patient willing to proceed with taking medications Raiford Simmonds, RN 09/18/2021 6:22 PM

## 2021-09-18 NOTE — Telephone Encounter (Signed)
-----   Message from Leonie Man, MD sent at 09/17/2021 10:40 PM EDT ----- Lab results: Cholesterol level still looks good.  Total cholesterol 94 with an LDL of 31.  Doing well with current regimen. Chemistry panel is pretty stable, however potassium level is mildly elevated.  About the same as last year. Would actually like to add low-dose HCTZ 12.5 mg daily.  This will help a little bit with blood pressure, but can also help bring potassium level down some.   Hemoglobin A1c level is pretty much stable at 6.0.  Was 5.8 2 years ago and 5.95 years ago.  This is right in the prediabetes range, but not yet at a level but we need to consider treating.  CBC is normal.  Stable from last year.  Overall labs look pretty good.  No need for changing.  Glenetta Hew, MD   New Rx - HCTZ 12.5 mg PO daily - Disp #90d supply, 3 refills

## 2021-10-04 ENCOUNTER — Other Ambulatory Visit: Payer: Self-pay | Admitting: Cardiology

## 2021-10-06 ENCOUNTER — Encounter: Payer: Self-pay | Admitting: Cardiology

## 2021-10-06 NOTE — Assessment & Plan Note (Signed)
Soft systolic murmur.  Nothing concerning.  If murmur gets louder, would recheck.  Otherwise we will continue to monitor

## 2021-10-06 NOTE — Assessment & Plan Note (Signed)
Class I stable angina.  He only notes that if he is walking briskly up hills or carrying something.  Not with routine activity.  He is not as active as he once was so was difficult to really tell.  He is able to do yard work etc. without difficulty.  He is on amlodipine 5 mg which seems to be keeping his angina at [place].  He did better off of beta-blocker which was causing fatigue.  Nitrates caused headache.  Try to avoid the cost of Ranexa.  At this point, would not look into further ischemic evaluation as he is quite stable.

## 2021-10-06 NOTE — Assessment & Plan Note (Signed)
Well-controlled blood pressure.  We stopped Toprol because of fatigue and continued on amlodipine 5 mg which is maintaining adequate control of his angina.

## 2021-10-06 NOTE — Assessment & Plan Note (Deleted)
Very distant history of MI with pill but only to the RCA as part of the PAMI trial. - June 1997.  Patient not relook cath in January 1998. Normal EF on echo with no significant regional wall motion normality.  No evidence of infarct on Myoview.

## 2021-10-06 NOTE — Assessment & Plan Note (Signed)
Per new guidelines, target LDL should be less than 55.  He is due for lab check as has not been checked in over a year.  Last LDL was 26.  He is on rosuvastatin 40 mg.   --> Labs were checked 2 days after the visit, and are reviewed below.  LDL still excellent at 31.  For now continue current dose of rosuvastatin.  Tolerating well without any myalgias.Omar [name]

## 2021-10-06 NOTE — Assessment & Plan Note (Signed)
Still has some fatigue, but seems to be better since we stopped the beta-blocker.  No change in angina with the increased dose of amlodipine continuing to stabilize symptoms.

## 2021-10-06 NOTE — Assessment & Plan Note (Signed)
Very distant history of MI with pill but only to the RCA as part of the PAMI trial. - June 1997.  Patient not relook cath in January 1998. Normal EF on echo with no significant regional wall motion normality.  No evidence of infarct on Myoview.

## 2021-10-06 NOTE — Assessment & Plan Note (Signed)
Pretty much single-vessel disease with occluded RCA at the time of his MI 25 years ago.  He had RCA PTCA/probe only as part of the PAMI trial.  Multiple follow-up studies showed that the RCA was patent.  He probably has microvascular disease causing some angina.  Intolerant to beta-blocker.  Feels much better off of the beta-blocker.  Energy level has improved.  Plan: Continue maintenance dose aspirin (which can be held for procedures or surgeries), stable dose of amlodipine although we do have room to titrate further, and current dose of rosuvastatin.  Unless symptoms worsen, I would not pursue further stress test evaluation.

## 2021-10-10 ENCOUNTER — Other Ambulatory Visit: Payer: Self-pay | Admitting: Family Medicine

## 2021-10-10 DIAGNOSIS — K5901 Slow transit constipation: Secondary | ICD-10-CM

## 2022-07-06 ENCOUNTER — Other Ambulatory Visit: Payer: Self-pay | Admitting: Cardiology

## 2022-07-11 ENCOUNTER — Ambulatory Visit: Payer: Medicare Other

## 2022-07-24 ENCOUNTER — Encounter: Payer: Self-pay | Admitting: Family Medicine

## 2022-07-24 ENCOUNTER — Ambulatory Visit (INDEPENDENT_AMBULATORY_CARE_PROVIDER_SITE_OTHER): Payer: Medicare Other | Admitting: Family Medicine

## 2022-07-24 VITALS — BP 130/77 | HR 67 | Temp 97.9°F | Ht 69.0 in | Wt 179.0 lb

## 2022-07-24 DIAGNOSIS — K5901 Slow transit constipation: Secondary | ICD-10-CM

## 2022-07-24 MED ORDER — TRULANCE 3 MG PO TABS: 3.0000 mg | ORAL_TABLET | Freq: Every day | ORAL | 2 refills | Status: DC

## 2022-07-24 NOTE — Progress Notes (Signed)
Subjective:  Patient ID: Omar [name], male    DOB: 03/23/1956  Age: 66 y.o. MRN: [id number]  CC: Constipation   HPI BIENVENIDO PROEHL presents for constipation. Bowels won't move unless he takes something. MEtamucil, Senna. Magnesium citrate cleans him out. Dulcolax doesn't work. Linzess worked but cost went from 6 dollars to $400 a month. He had to quit.      07/24/2022    3:33 PM 07/23/2021   10:40 AM 07/10/2021    3:37 PM  Depression screen PHQ 2/9  Decreased Interest 0 0 0  Down, Depressed, Hopeless 0 0 0  PHQ - 2 Score 0 0 0    History Froylan has a past medical history of Anxiety (07-19-2003), Aortic valve sclerosis (January 2014), CAD S/P percutaneous coronary angioplasty (05/30/1996), Chronic low back pain, Dyslipidemia, goal LDL below 70, Hypertension, Lumbosacral disc disease (Feb,1 2011), Obstructive sleep apnea (07-19-2003), and ST elevation myocardial infarction (STEMI) involving right coronary artery in recovery phase ([place]) (05/30/1996).   He has a past surgical history that includes Hernia repair (2005); NM MYOVIEW LTD (05/2008); transthoracic echocardiogram (January 2014); Colonoscopy (N/A, 10/27/2018); polypectomy (10/27/2018); and LEFT HEART CATH AND CORONARY ANGIOGRAPHY (12-23-1996).   His family history includes Healthy in his mother; Other in his father.He reports that he quit smoking about 26 years ago. His smoking use included cigarettes. He has quit using smokeless tobacco. He reports current alcohol use. He reports that he does not currently use drugs after having used the following drugs: Marijuana.    ROS Review of Systems  Constitutional:  Negative for fever.  Respiratory:  Negative for shortness of breath.   Cardiovascular:  Negative for chest pain.  Musculoskeletal:  Negative for arthralgias.  Skin:  Negative for rash.    Objective:  BP 130/77   Pulse 67   Temp 97.9 F (36.6 C)   Ht '5\' 9"'$  (1.753 m)   Wt 179 lb (81.2 kg)   SpO2 97%   BMI  26.43 kg/m   BP Readings from Last 3 Encounters:  07/24/22 130/77  09/10/21 125/80  07/23/21 129/76    Wt Readings from Last 3 Encounters:  07/24/22 179 lb (81.2 kg)  09/10/21 181 lb 6.4 oz (82.3 kg)  07/23/21 181 lb 6.4 oz (82.3 kg)     Physical Exam Vitals reviewed.  Constitutional:      Appearance: He is well-developed.  HENT:     Head: Normocephalic and atraumatic.     Right Ear: External ear normal.     Left Ear: External ear normal.     Mouth/Throat:     Pharynx: No oropharyngeal exudate or posterior oropharyngeal erythema.  Eyes:     Pupils: Pupils are equal, round, and reactive to light.  Cardiovascular:     Rate and Rhythm: Normal rate and regular rhythm.     Heart sounds: No murmur heard. Pulmonary:     Effort: No respiratory distress.     Breath sounds: Normal breath sounds.  Abdominal:     General: There is distension.  Musculoskeletal:     Cervical back: Normal range of motion and neck supple.  Neurological:     Mental Status: He is alert and oriented to person, place, and time.       Assessment & Plan:   Garhett was seen today for constipation.  Diagnoses and all orders for this visit:  Constipation by delayed colonic transit  Other orders -     Plecanatide ([name]) 3 MG TABS; Take 3  mg by mouth daily.       I have discontinued Filimon Miranda. Kitko's lubiprostone. I am also having him start on [name]. Additionally, I am having him maintain his aspirin, vitamin E, nitroGLYCERIN, hydrochlorothiazide, rosuvastatin, ezetimibe, and amLODipine.  Allergies as of 07/24/2022   No Known Allergies      Medication List        Accurate as of July 24, 2022 11:59 PM. If you have any questions, ask your nurse or doctor.          STOP taking these medications    lubiprostone 24 MCG capsule Commonly known as: AMITIZA Stopped by: Claretta Fraise, MD       TAKE these medications    amLODipine 5 MG tablet Commonly known as:  NORVASC TAKE 1 TABLET BY MOUTH EVERY DAY   aspirin 81 MG tablet Take 81 mg by mouth daily.   ezetimibe 10 MG tablet Commonly known as: ZETIA TAKE 1 TABLET BY MOUTH EVERY DAY   hydrochlorothiazide 12.5 MG capsule Commonly known as: MICROZIDE Take 1 capsule (12.5 mg total) by mouth daily.   nitroGLYCERIN 0.4 MG SL tablet Commonly known as: NITROSTAT Place 1 tablet (0.4 mg total) under the tongue every 5 (five) minutes as needed for chest pain.   rosuvastatin 40 MG tablet Commonly known as: CRESTOR TAKE 1 TABLET BY MOUTH EVERY DAY   [name] 3 MG Tabs Generic drug: Plecanatide Take 3 mg by mouth daily. Started by: Claretta Fraise, MD   vitamin E 180 MG (400 UNITS) capsule Take 400 Units by mouth daily.         Follow-up: Return in about 6 months (around 01/24/2023).  Claretta Fraise, M.D.

## 2022-07-28 ENCOUNTER — Encounter: Payer: Self-pay | Admitting: Family Medicine

## 2022-07-31 ENCOUNTER — Telehealth: Payer: Self-pay | Admitting: Family Medicine

## 2022-07-31 ENCOUNTER — Other Ambulatory Visit: Payer: Self-pay | Admitting: Family Medicine

## 2022-07-31 MED ORDER — MOTEGRITY 2 MG PO TABS: 2.0000 mg | ORAL_TABLET | Freq: Every day | ORAL | 2 refills | Status: DC

## 2022-07-31 NOTE — Telephone Encounter (Signed)
Please let the patient know that I sent their prescription to their pharmacy. Thanks, WS 

## 2022-08-01 NOTE — Telephone Encounter (Signed)
Attempted to contact - NA 

## 2022-09-05 ENCOUNTER — Other Ambulatory Visit: Payer: Self-pay | Admitting: Cardiology

## 2022-09-19 ENCOUNTER — Other Ambulatory Visit: Payer: Self-pay | Admitting: Cardiology

## 2022-09-24 ENCOUNTER — Encounter: Payer: Self-pay | Admitting: Cardiology

## 2022-09-24 ENCOUNTER — Ambulatory Visit: Payer: Medicare Other | Attending: Cardiology | Admitting: Cardiology

## 2022-09-24 VITALS — BP 132/68 | HR 56 | Ht 67.0 in | Wt 178.8 lb

## 2022-09-24 DIAGNOSIS — I252 Old myocardial infarction: Secondary | ICD-10-CM | POA: Diagnosis not present

## 2022-09-24 DIAGNOSIS — R739 Hyperglycemia, unspecified: Secondary | ICD-10-CM | POA: Diagnosis not present

## 2022-09-24 DIAGNOSIS — I2089 Other forms of angina pectoris: Secondary | ICD-10-CM

## 2022-09-24 DIAGNOSIS — I2111 ST elevation (STEMI) myocardial infarction involving right coronary artery: Secondary | ICD-10-CM | POA: Diagnosis not present

## 2022-09-24 DIAGNOSIS — G4733 Obstructive sleep apnea (adult) (pediatric): Secondary | ICD-10-CM | POA: Diagnosis not present

## 2022-09-24 DIAGNOSIS — E785 Hyperlipidemia, unspecified: Secondary | ICD-10-CM

## 2022-09-24 DIAGNOSIS — R7301 Impaired fasting glucose: Secondary | ICD-10-CM

## 2022-09-24 DIAGNOSIS — I25119 Atherosclerotic heart disease of native coronary artery with unspecified angina pectoris: Secondary | ICD-10-CM

## 2022-09-24 DIAGNOSIS — R5383 Other fatigue: Secondary | ICD-10-CM | POA: Diagnosis not present

## 2022-09-24 DIAGNOSIS — I10 Essential (primary) hypertension: Secondary | ICD-10-CM | POA: Diagnosis not present

## 2022-09-24 NOTE — Assessment & Plan Note (Signed)
Continue therapy

## 2022-09-24 NOTE — Assessment & Plan Note (Signed)
Distant history of inferior MI RCA recanalized.  EF preserved on echo indicating that the urethral wall is getting some perfusion.  No further anginal symptoms.  He can barely remember his MI.

## 2022-09-24 NOTE — Assessment & Plan Note (Signed)
Blood pressure is little high today for him I rechecked it it was 136/78.  Continue to monitor. We will continue current dose of amlodipine plus HCTZ.  If pressure increases, would probably consider increasing amlodipine dose.

## 2022-09-24 NOTE — Assessment & Plan Note (Signed)
Check A1c with upcoming labs.

## 2022-09-24 NOTE — Assessment & Plan Note (Signed)
Please try to make dietary modifications and increased exercise, will check a A1c on him.  Defer management to PCP.

## 2022-09-24 NOTE — Assessment & Plan Note (Signed)
Distant history of doing back in 1998.  Echo shows no significant regional wall motion.  No angina or  heart failure symptoms.

## 2022-09-24 NOTE — Assessment & Plan Note (Signed)
Not on beta-blocker.  Doing better.  A lot of his fatigue was also related to chronic congestion.  Now this is resolved, he is doing much better.

## 2022-09-24 NOTE — Assessment & Plan Note (Signed)
Symptoms he would clearly be consistent with stable angina.  We talked about what to do if symptoms were to progress and become more frequent or more with less activity. For now we will hold off on any further antianginals unless symptoms worsen.  If pressures continue to elevate, we can increase his amlodipine dose.

## 2022-09-24 NOTE — Patient Instructions (Addendum)
Medication Instructions:  No changes      Lab Work: Lipid Cmp hgbA1c   If you have labs (blood work) drawn today and your tests are completely normal, you will receive your results only by: MyChart Message (if you have MyChart) OR A paper copy in the mail If you have any lab test that is abnormal or we need to change your treatment, we will call you to review the results.   Testing/Procedures:  Not needed  Follow-Up: At [facility], you and your health needs are our priority.  As part of our continuing mission to provide you with exceptional heart care, we have created designated Provider Care Teams.  These Care Teams include your primary Cardiologist (physician) and Advanced Practice Providers (APPs -  Physician Assistants and Nurse Practitioners) who all work together to provide you with the care you need, when you need it.  We recommend signing up for the patient portal called "MyChart".  Sign up information is provided on this After Visit Summary.  MyChart is used to connect with patients for Virtual Visits (Telemedicine).  Patients are able to view lab/test results, encounter notes, upcoming appointments, etc.  Non-urgent messages can be sent to your provider as well.   To learn more about what you can do with MyChart, go to [email or web address].    Your next appointment:   12 month(s)  The format for your next appointment:   In Person  Provider:   Glenetta Hew, MD

## 2022-09-24 NOTE — Progress Notes (Signed)
Primary Care Provider: Claretta Fraise, MD [place] Cardiologist: Glenetta Hew, MD Electrophysiologist: None  Clinic Note: Chief Complaint  Patient presents with   Follow-up   Coronary Artery Disease    Chronic stable angina - when "overdoes it"    ===================================  ASSESSMENT/PLAN    Problem List Items Addressed This Visit       Cardiology Problems   Coronary artery disease involving native coronary artery of native heart with angina pectoris ([place]) - Primary (Chronic)    Chronic single-vessel disease with occluded RCA-PTCA over 25 years ago.  Follow-up studies actually suggest that the RCA is still patent.  Would not stress test.  He is pretty much stable exertional angina not with routine activity and even with moderate activities does okay.   Plan: Not able to use beta-blocker because of fatigue and concerns of bradycardia. In the absence of beta-blocker is on amlodipine for antianginal benefit. On combination of rosuvastatin and Zetia for lipids-> labs pending On aspirin monotherapy      Relevant Orders   EKG 12-Lead   Hemoglobin A1c   Stable angina (Chronic)    Symptoms he would clearly be consistent with stable angina.  We talked about what to do if symptoms were to progress and become more frequent or more with less activity. For now we will hold off on any further antianginals unless symptoms worsen.  If pressures continue to elevate, we can increase his amlodipine dose.      Relevant Orders   EKG 12-Lead   Essential hypertension (Chronic)    Blood pressure is little high today for him I rechecked it it was 136/78.  Continue to monitor. We will continue current dose of amlodipine plus HCTZ.  If pressure increases, would probably consider increasing amlodipine dose.      Hyperlipidemia with target LDL less than 70 (Chronic)   Relevant Orders   Lipid panel   Comprehensive metabolic panel   Hemoglobin A1c   Myocardial  infarction (HCC) (Chronic)    Distant history of inferior MI RCA recanalized.  EF preserved on echo indicating that the urethral wall is getting some perfusion.  No further anginal symptoms.  He can barely remember his MI.      Relevant Orders   EKG 12-Lead     Other   History of acute inferior wall MI (Chronic)    Distant history of doing back in 1998.  Echo shows no significant regional wall motion.  No angina or  heart failure symptoms.      Elevated blood sugar level    Please try to make dietary modifications and increased exercise, will check a A1c on him.  Defer management to PCP.      Relevant Orders   Hemoglobin A1c   Fatigue due to treatment    Not on beta-blocker.  Doing better.  A lot of his fatigue was also related to chronic congestion.  Now this is resolved, he is doing much better.      Impaired fasting glucose (Chronic)    Check A1c with upcoming labs.      Obstructive sleep apnea (Chronic)    Continue therapy       ===================================  HPI:    Omar [name] is a 66 y.o. male with a PMH notable for CAD with CRFs of HTN HLD who presents today for annual f/u. He returns today at the request of Claretta Fraise, MD.  He is a former patient of Dr. Terance Ice and was  enrolled in the PAMI trial in June of 1997. May 30, 1996 - Inf STEMI => RCA occluded  - PTCA/POBA (NOT STENT) Re-look Cath Jan 1998-minimal residual stenosis at the PTCA site. Multiple negative stress test.  Most recently in 2009. Has had mild class I stable angina for years  THERIN VETSCH was last seen on 09/10/2021 -> doing okay.  Playing golf most days but riding the golf cart more than walking.  Not a lot of energy but tries to go walking the neighborhood.  Noted getting short of breath going uphill and some chest tightness/burning when he overexerts.  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  Just to stop and catch his breath.  Rare skipping beats with  a couple seconds.  Stable.  Recent Hospitalizations: n/a  Reviewed  CV studies:    The following studies were reviewed today: (if available, images/films reviewed: From Epic Chart or Care Everywhere) N/a:  Interval History:   Omar [name] returns here today for annual follow-up doing quite well.  As usual he really only notes having shortness of breath or anything if he overdoes it.  For instance if he is walking up a hill or up an incline/stairs, even if he goes too hard he will either stop and just catch his breath.  If not he will start having a burning sensation across his chest.  He says it does give out a little bit early earlier than he used to, but not significant change from before.  He does note that while she is already felt worn out and fatigued a whole lot more just lack of energy and upset stomach, he says now he is doing a lot better.  He is figured out a regimen to keep his bowels active-previously maybe go for 5 days without a bowel movement he goes to spend every day.  Since, he overall feels much better.  CV Review of Symptoms (Summary): Cardiovascular ROS: positive for - chest pain, dyspnea on exertion, and this is-brushing of the show, climbing stairs.  But not with routine walking flat ground.  He can play golf, use backhoe with no issues. negative for - edema, irregular heartbeat, orthopnea, palpitations, paroxysmal nocturnal dyspnea, rapid heart rate, shortness of breath, or lightheadedness dizziness or wooziness, syncope/near syncope or TIA/number claudication  REVIEWED OF SYSTEMS   Review of Systems  Constitutional:  Positive for malaise/fatigue (Overall, he says energy level has definitely improved since his GI issues resolved.).  HENT:  Negative for congestion.   Respiratory:  Negative for cough, sputum production and wheezing.   Gastrointestinal:  Negative for abdominal pain, blood in stool, constipation (Has regulated bowel movements with magnesium and  yogurt as well as fiber) and melena.       No longer having issues with constipation.  Genitourinary:  Negative for hematuria.  Musculoskeletal:  Positive for joint pain (Knees).  Neurological:  Negative for dizziness, weakness and headaches.  Psychiatric/Behavioral:  Negative for depression. The patient is not nervous/anxious and does not have insomnia.    I have reviewed and (if needed) personally updated the patient's problem list, medications, allergies, past medical and surgical history, social and family history.   PAST MEDICAL HISTORY   Past Medical History:  Diagnosis Date   Anxiety 07-19-2003   [facility]   Aortic valve sclerosis January 2014   Noted on echocardiogram to have aortic sclerosis without stenosis. Ordered for murmur   CAD S/P percutaneous coronary angioplasty 05/30/1996   single -vessel  disease (RCA) Rx with POBA on PAMI protocol in june 1997; Re-Look Cath 12/1996: ~20-30% residual no stenosis in 1998 with good LV Function; Myoview 6/'09:  low risk scan ,post EF 58%, this was the fourth such scan in as many years   Chronic low back pain    Has seen Dr. Marlou Sa   Dyslipidemia, goal LDL below 70    On statin   Hypertension    Lumbosacral disc disease Feb,1 2011   ThIs information from CT done several years ago   Obstructive sleep apnea 07-19-2003   breathing disorder services,Dr. Steward Drone. 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   ST elevation myocardial infarction (STEMI) involving right coronary artery in recovery phase ([place]) 05/30/1996   100% occluded RCA -- PTCA-POBA (~20% residual) of RCA (part of PAMI Trial) ;; Normal EF By Echo in 2014    PAST SURGICAL HISTORY   Past Surgical History:  Procedure Laterality Date   COLONOSCOPY N/A 10/27/2018   Procedure: COLONOSCOPY;  Surgeon: Rogene Houston, MD;  Location[facility];  Service: Endoscopy;  Laterality: N/A;  [place]  2005   remote laparscopic hernia repair by Dr.martin in may 2005   LEFT HEART CATH AND CORONARY ANGIOGRAPHY  12-23-1996   ASHD h/o acute DMI with emergency PTCA- randomized to balloon PTCA 05-30-1996 reperfusion time 2 hrs 2 min.,single vessel coronary disease RCA   NM MYOVIEW LTD  05/2008   Mild anterior defect consistent with chest wall attenuation. Normal EF and no ischemia, no infarction   POLYPECTOMY  10/27/2018   Procedure: POLYPECTOMY;  Surgeon: Rogene Houston, MD;  Location: [facility];  Service: Endoscopy;;  colon   TRANSTHORACIC ECHOCARDIOGRAM  January 2014   Mild concentric LVH. Normal systolic and diastolic function with EF 55-65%. Mild LA dilation. Aortic sclerosis without stenosis.    Immunization History  Administered Date(s) Administered   Fluad Quad(high Dose 65+) 10/11/2019   Influenza, High Dose Seasonal PF 01/09/2021   Influenza,inj,Quad PF,6+ Mos 08/13/2018   Moderna Sars-Covid-2 Vaccination 01/02/2020, 01/30/2020    MEDICATIONS/ALLERGIES   Current Meds  Medication Sig   amLODipine (NORVASC) 5 MG tablet Take 1 tablet (5 mg total) by mouth daily. Please keep scheduled appoint   aspirin 81 MG tablet Take 81 mg by mouth daily.   ezetimibe (ZETIA) 10 MG tablet TAKE 1 TABLET BY MOUTH EVERY DAY   hydrochlorothiazide (MICROZIDE) 12.5 MG capsule TAKE 1 CAPSULE BY MOUTH EVERY DAY   nitroGLYCERIN (NITROSTAT) 0.4 MG SL tablet Place 1 tablet (0.4 mg total) under the tongue every 5 (five) minutes as needed for chest pain.   Prucalopride Succinate (MOTEGRITY) 2 MG TABS Take 1 tablet (2 mg total) by mouth daily.   rosuvastatin (CRESTOR) 40 MG tablet TAKE 1 TABLET BY MOUTH EVERY DAY   vitamin E 400 UNIT capsule Take 400 Units by mouth daily.    No Known Allergies  SOCIAL HISTORY/FAMILY HISTORY   Reviewed in Epic:  Pertinent findings:  Social History   Tobacco Use   Smoking status: Former    Types: Cigarettes    Quit date: 12/02/1995    Years since  quitting: 26.8   Smokeless tobacco: Former  Substance Use Topics   Alcohol use: Yes    Comment: occasionally   Drug use: Not Currently    Types: Marijuana    Comment: in the 1960's   Social History  Social History Narrative   Married father of 2 with 3 children. He quit smoking in 1997.   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 He does drink not alcohol.    OBJCTIVE -PE, EKG, labs   Wt Readings from Last 3 Encounters:  09/24/22 178 lb 12.8 oz (81.1 kg)  07/24/22 179 lb (81.2 kg)  09/10/21 181 lb 6.4 oz (82.3 kg)    Physical Exam: BP 132/68   Pulse (!) 56   Ht '5\' 7"'$  (1.702 m)   Wt 178 lb 12.8 oz (81.1 kg)   SpO2 99%   BMI 28.00 kg/m  Physical Exam Constitutional:      General: He is not in acute distress.    Appearance: Normal appearance. He is normal weight. He is not ill-appearing or toxic-appearing.  HENT:     Head: Normocephalic and atraumatic.  Neck:     Vascular: No carotid bruit or JVD.  Cardiovascular:     Rate and Rhythm: Normal rate and regular rhythm. No extrasystoles are present.    Pulses: Normal pulses.     Heart sounds: S1 normal and S2 normal. Murmur (Stable 1/6 COPD SEM at RUSB.  No radiation) heard.     No friction rub. No gallop.  Musculoskeletal:     Cervical back: Normal range of motion and neck supple.  Neurological:     Mental Status: He is alert.     Adult ECG Report  Rate: 58 ;  Rhythm: sinus bradycardia and otherwise normal axis, normal durations. ;   Narrative Interpretation: stable  Recent Labs: Due for labs today  Lab Results  Component Value Date   CHOL 94 (L) 09/12/2021   HDL 44 09/12/2021   LDLCALC 31 09/12/2021   TRIG 97 09/12/2021   CHOLHDL 2.1 09/12/2021   Lab Results  Component Value Date   CREATININE 0.94 09/12/2021   BUN 12 09/12/2021   NA 142 09/12/2021   K 5.3 (H) 09/12/2021   CL 106 09/12/2021   CO2 25 09/12/2021      Latest Ref Rng & Units 09/12/2021    8:07 AM 10/11/2019   10:30 AM  CBC  WBC 3.4 -  10.8 x10E3/uL 7.1  6.4   Hemoglobin 13.0 - 17.7 g/dL 14.7  14.8   Hematocrit 37.5 - 51.0 % 42.0  43.3   Platelets 150 - 450 x10E3/uL 245  235     Lab Results  Component Value Date   HGBA1C 6.0 (H) 09/12/2021   No results found for: "TSH"  ================================================== I spent a total of 26 min with the patient spent in direct patient consultation.  Additional time spent with chart review  / charting (studies, outside notes, etc): 16 min Total Time: 42 min  Current medicines are reviewed at length with the patient today.  (+/- concerns) n/a  Notice: This dictation was prepared with Dragon dictation along with smart phrase technology. Any transcriptional errors that result from this process are unintentional and may not be corrected upon review.  Studies Ordered:   Orders Placed This Encounter  Procedures   Lipid panel   Comprehensive metabolic panel   Hemoglobin A1c   EKG 12-Lead   No orders of the defined types were placed in this encounter.   Patient Instructions / Medication Changes & Studies & Tests Ordered   Patient Instructions  Medication Instructions:  No changes      Lab Work: Lipid Cmp hgbA1c   If you have labs (blood work) drawn today and your  tests are completely normal, you will receive your results only by: MyChart Message (if you have MyChart) OR A paper copy in the mail If you have any lab test that is abnormal or we need to change your treatment, we will call you to review the results.   Testing/Procedures:  Not needed  Follow-Up: At [facility], you and your health needs are our priority.  As part of our continuing mission to provide you with exceptional heart care, we have created designated Provider Care Teams.  These Care Teams include your primary Cardiologist (physician) and Advanced Practice Providers (APPs -  Physician Assistants and Nurse Practitioners) who all work together to provide you with the care you  need, when you need it.  We recommend signing up for the patient portal called "MyChart".  Sign up information is provided on this After Visit Summary.  MyChart is used to connect with patients for Virtual Visits (Telemedicine).  Patients are able to view lab/test results, encounter notes, upcoming appointments, etc.  Non-urgent messages can be sent to your provider as well.   To learn more about what you can do with MyChart, go to [email or web address].    Your next appointment:   12 month(s)  The format for your next appointment:   In Person  Provider:   Glenetta Hew, MD        Leonie Man, MD, MS Glenetta Hew, M.D., M.S. Interventional Cardiologist  [place]  Pager # [phone] Phone # [phone] [address]. [place], [place] [address]   Thank you for choosing [facility] [place]!!

## 2022-09-24 NOTE — Assessment & Plan Note (Signed)
Chronic single-vessel disease with occluded RCA-PTCA over 25 years ago.  Follow-up studies actually suggest that the RCA is still patent.  Would not stress test.  He is pretty much stable exertional angina not with routine activity and even with moderate activities does okay.   Plan:  Not able to use beta-blocker because of fatigue and concerns of bradycardia.  In the absence of beta-blocker is on amlodipine for antianginal benefit.  On combination of rosuvastatin and Zetia for lipids-> labs pending  On aspirin monotherapy

## 2022-09-25 LAB — COMPREHENSIVE METABOLIC PANEL
ALT: 22 IU/L (ref 0–44)
AST: 29 IU/L (ref 0–40)
Albumin/Globulin Ratio: 2.1 (ref 1.2–2.2)
Albumin: 4.9 g/dL (ref 3.9–4.9)
Alkaline Phosphatase: 91 IU/L (ref 44–121)
BUN/Creatinine Ratio: 15 (ref 10–24)
BUN: 15 mg/dL (ref 8–27)
Bilirubin Total: 1.2 mg/dL (ref 0.0–1.2)
CO2: 26 mmol/L (ref 20–29)
Calcium: 10.3 mg/dL — ABNORMAL HIGH (ref 8.6–10.2)
Chloride: 99 mmol/L (ref 96–106)
Creatinine, Ser: 1 mg/dL (ref 0.76–1.27)
Globulin, Total: 2.3 g/dL (ref 1.5–4.5)
Glucose: 92 mg/dL (ref 70–99)
Potassium: 4.5 mmol/L (ref 3.5–5.2)
Sodium: 139 mmol/L (ref 134–144)
Total Protein: 7.2 g/dL (ref 6.0–8.5)
eGFR: 82 mL/min/{1.73_m2} (ref >59)

## 2022-09-25 LAB — LIPID PANEL
Chol/HDL Ratio: 2.2 ratio (ref 0.0–5.0)
Cholesterol, Total: 112 mg/dL (ref 100–199)
HDL: 50 mg/dL (ref >39)
LDL Chol Calc (NIH): 39 mg/dL (ref 0–99)
Triglycerides: 132 mg/dL (ref 0–149)
VLDL Cholesterol Cal: 23 mg/dL (ref 5–40)

## 2022-09-25 LAB — HEMOGLOBIN A1C
Est. average glucose Bld gHb Est-mCnc: 126 mg/dL
Hgb A1c MFr Bld: 6 % — ABNORMAL HIGH (ref 4.8–5.6)

## 2022-10-01 ENCOUNTER — Other Ambulatory Visit: Payer: Self-pay | Admitting: Cardiology

## 2022-10-04 ENCOUNTER — Other Ambulatory Visit: Payer: Self-pay | Admitting: Cardiology

## 2022-10-08 ENCOUNTER — Telehealth: Payer: Self-pay | Admitting: *Deleted

## 2022-10-08 NOTE — Telephone Encounter (Signed)
-----   Message from Leonie Man, MD sent at 09/28/2022 11:21 PM EDT ----- Chemistry panel pretty normal.  Calcium is a little above abnormal normal but within normal error.  Otherwise normal kidney and liver function. A1c is pretty stable at 6.0.  Would like to be less than 5.6 but this is still in the prediabetes range. Cholesterol levels look great.  A little bit up from last year but pretty stable.  Everything is just a slight bit higher including cholesterol 112 (had been 94), triglycerides had been 97 now 132, LDL has been 31 now 39.  But the HDL which is good cholesterol actually went up as well to 50.  Overall still happy with this.  Continue current meds.  Continue with diet and exercise plan.  Glenetta Hew, MD

## 2022-10-08 NOTE — Telephone Encounter (Signed)
Left message on both phone to call office for results of labs.

## 2022-10-09 NOTE — Telephone Encounter (Signed)
The patient has been notified of the result and verbalized understanding.  All questions (if any) were answered. Raiford Simmonds, RN 10/09/2022 1:00 PM

## 2023-02-12 ENCOUNTER — Telehealth: Payer: Self-pay | Admitting: Family Medicine

## 2023-02-12 NOTE — Telephone Encounter (Signed)
Called patient to schedule Medicare Annual Wellness Visit (AWV). Left message for patient to call back and schedule Medicare Annual Wellness Visit (AWV).  Last date of AWV: 07/10/2021   Please schedule an appointment at any time with either Mickel Baas or [place], NHA's. .  If any questions, please contact me at [phone].  Thank you,  Colletta Maryland,  [place] Program Direct Dial ??[phone]

## 2023-03-10 ENCOUNTER — Telehealth: Payer: Self-pay | Admitting: Family Medicine

## 2023-03-10 NOTE — Telephone Encounter (Signed)
Called patient to schedule Medicare Annual Wellness Visit (AWV). Left message for patient to call back and schedule Medicare Annual Wellness Visit (AWV).  Last date of AWV: 07/10/2021   Please schedule an appointment at any time with either Laura or [place], NHA's. .  If any questions, please contact me at [phone].  Thank you,  Stephanie,  AMB Clinical Support [facility] AWV Program Direct Dial ??[phone]   

## 2023-04-07 ENCOUNTER — Telehealth: Payer: Self-pay | Admitting: Family Medicine

## 2023-04-07 NOTE — Telephone Encounter (Signed)
Called patient to schedule Medicare Annual Wellness Visit (AWV). Left message for patient to call back and schedule Medicare Annual Wellness Visit (AWV).  Last date of AWV: 07/10/2021   Please schedule an appointment at any time with Vernona Rieger, [facility]. .  If any questions, please contact me at [phone].  Thank you,  Judeth Cornfield,  AMB Clinical Support [facility] AWV Program Direct Dial ??[phone]

## 2023-09-19 ENCOUNTER — Other Ambulatory Visit: Payer: Self-pay | Admitting: Cardiology

## 2023-09-21 NOTE — Progress Notes (Addendum)
Cardiology Office Note:    Date:  09/29/2023   ID:  VADA LINHARDT, DOB [birth year], MRN [id number]  PCP:  Mechele Claude, MD  Cardiologist:  Bryan Lemma, MD     Referring MD: Mechele Claude, MD   Chief Complaint: routine follow-up of CAD  History of Present Illness:    Omar [name] is a 67 y.o. male with a history of CAD with remote STEMI in 05/1996 s/p remote PTCA to RCA with stable angina, hypertension, hyperlipidemia, obstructive sleep apnea not on CPAP, and chronic back pain who is followed by Dr. Herbie Baltimore and presents today for routine follow-up.   Patient has a long history of CAD with remote inferior STEMI in 05/1996. Cardiac catheterization at that time showed occluded RCA which was successful treated with PTCA/ POBA (no stenting). Re-look cath in 12/1996 showed minimal residual stenosis at the PTCA site. He has had multiple negative stress test since that time, most recently in 2009. Last Echo in 12/2012 showed LVEF of 55-65% with mild LVH. He has had mild class I stable angina for years. He was last seen by Dr. Herbie Baltimore in 08/2022 at which time he was doing quite well. He reported some dyspnea on exertion if he overdoes it followed by some burning across his chest if he does not stop and catch his breath when this happens. However this, was stable. No changes were made at that time but plan was to increase his Amlodipine dose if symptoms worsen.   Patient presents today for routine follow-up. Here alone. He is doing well from a cardiac standpoint. He describes occasional very atypical chest pain that he describes as a brief "whooshing" sensation of cool air across his chest that last only a couple of seconds and then goes away. He denies any chest pain. No exertional chest pain. No shortness of breath, orthopnea, PND, or edema. No palpitations, lightheadedness, dizziness, or syncope. He has had a lot of back pain over the last 8-9 months and describes radiation down his legs as  well as numbness/ tingling in his legs (left leg is worse than the right). He does describe pain in his legs while ambulating. However, he has good posterior tibial and distal pedal pulses on exam and denies any skin discoloration or slow healing wounds. He does have a history of chronic low back pain with prior injuries and has had multiple injections in the past. Symptoms sound more consistent with low back issues/ sciatica than PAD.  EKGs/Labs/Other Studies Reviewed:    The following studies were reviewed:  Please see HPI above.  EKG:  EKG  ordered today.   EKG Interpretation Date/Time:  Tuesday September 29 2023 08:00:58 EDT Ventricular Rate:  56 PR Interval:  142 QRS Duration:  82 QT Interval:  412 QTC Calculation: 397 R Axis:   18  Text Interpretation: Sinus bradycardia Non-specific T wave changes Confirmed by Marjie Skiff [phone]) on 09/29/2023 8:04:19 AM    Recent Labs: No results found for requested labs within last 365 days.  Recent Lipid Panel    Component Value Date/Time   CHOL 112 09/24/2022 0845   TRIG 132 09/24/2022 0845   HDL 50 09/24/2022 0845   CHOLHDL 2.2 09/24/2022 0845   CHOLHDL 3.4 03/31/2017 0947   VLDL 29 03/31/2017 0947   LDLCALC 39 09/24/2022 0845    Physical Exam:    Vital Signs: BP 116/72   Pulse (!) 56   Ht 5\' 7"  (1.702 m)   Wt 178 lb 3.2  oz (80.8 kg)   SpO2 98%   BMI 27.91 kg/m     Wt Readings from Last 3 Encounters:  09/29/23 178 lb 3.2 oz (80.8 kg)  09/24/22 178 lb 12.8 oz (81.1 kg)  07/24/22 179 lb (81.2 kg)     General: 67 y.o. Caucasianmale in no acute distress. HEENT: Normocephalic and atraumatic. Sclera clear.  Neck: Supple. No carotid bruits. No JVD. Heart: Mildly bradycardic with normal rhythm. Distinct S1 and S2. No murmurs, gallops, or rubs.  Lungs: No increased work of breathing. Clear to ausculation bilaterally. No wheezes, rhonchi, or rales.  Abdomen: Soft, non-distended, and non-tender to palpation.  Extremities:  No lower extremity edema.  Posterior tibial and distal pedal pulses 2+ and equal bilaterally. Skin: Warm and dry. Neuro: No focal deficits. Psych: Normal affect. Responds appropriately.  Assessment:    1. Coronary artery disease involving native coronary artery of native heart without angina pectoris   2. Essential hypertension   3. Hyperlipidemia, unspecified hyperlipidemia type   4. Obstructive sleep apnea   5. Pain in both lower extremities     Plan:    CAD Patient has a history of remote inferior STEMI in 05/1996 which was treated with PTCA/ POBA. He has had multiple negative stress test since that time, most recently in 2009. He has also had stable angina for years.  - He describes very occasional very atypical chest pain but nothing that sounds like angina. - Continue Amlodipine 5mg  daily.  - No beta-blocker given fatigue and concerns for bradycardia.  - Continue aspirin and statin/ Zetia.  - No additional ischemic work-up necessary at this time.  Hypertension BP well controlled. - Continue Amlodipine 5mg  daily and HCTZ 12.5mg  daily.   Hyperlipidemia Lipid panel in 08/2022: Total Cholesterol 112, Triglycerides 132, HDL 50, LDL 39. LDL goal <70 given CAD. - Continue Crestor 40mg  daily and Zetia 10mg  daily.  - Will repeat lipid panel and CMET today.   Obstructive Sleep Apnea - Intolerant to CPAP in the past. No interest in retrying this.  Leg Pain Patient describes bilateral leg pain with numbness/ tingling (left > right) that seems radiate down from lower back. He has a history of chronic back pain and states he has had multiple injections in the past.  - He has good posterior tibial and distal pedal pulses on exam. He denies any skin discoloration or slow healing wounds.  - Do not suspect PAD at this time. Symptoms sound more consistent with sciatica/ low back issues. - Recommended following back up Ortho. He states he has previously seen Dr. August Saucer.  Disposition: Follow  up in 1 year.   Signed, Corrin Parker, PA-C  09/29/2023 8:29 AM    [facility]

## 2023-09-29 ENCOUNTER — Encounter: Payer: Self-pay | Admitting: Student

## 2023-09-29 ENCOUNTER — Ambulatory Visit: Payer: Medicare Other | Attending: Student | Admitting: Student

## 2023-09-29 VITALS — BP 116/72 | HR 56 | Ht 67.0 in | Wt 178.2 lb

## 2023-09-29 DIAGNOSIS — M79605 Pain in left leg: Secondary | ICD-10-CM

## 2023-09-29 DIAGNOSIS — I2510 Atherosclerotic heart disease of native coronary artery without angina pectoris: Secondary | ICD-10-CM | POA: Diagnosis not present

## 2023-09-29 DIAGNOSIS — I10 Essential (primary) hypertension: Secondary | ICD-10-CM

## 2023-09-29 DIAGNOSIS — I25118 Atherosclerotic heart disease of native coronary artery with other forms of angina pectoris: Secondary | ICD-10-CM | POA: Diagnosis not present

## 2023-09-29 DIAGNOSIS — E785 Hyperlipidemia, unspecified: Secondary | ICD-10-CM | POA: Diagnosis not present

## 2023-09-29 DIAGNOSIS — G4733 Obstructive sleep apnea (adult) (pediatric): Secondary | ICD-10-CM | POA: Diagnosis not present

## 2023-09-29 DIAGNOSIS — M79604 Pain in right leg: Secondary | ICD-10-CM

## 2023-09-29 NOTE — Patient Instructions (Signed)
Medication Instructions:  No Changes *If you need a refill on your cardiac medications before your next appointment, please call your pharmacy*   Lab Work: CMET, Lipid Panel If you have labs (blood work) drawn today and your tests are completely normal, you will receive your results only by: MyChart Message (if you have MyChart) OR A paper copy in the mail If you have any lab test that is abnormal or we need to change your treatment, we will call you to review the results.   Testing/Procedures: No Testing   Follow-Up: At [facility], you and your health needs are our priority.  As part of our continuing mission to provide you with exceptional heart care, we have created designated Provider Care Teams.  These Care Teams include your primary Cardiologist (physician) and Advanced Practice Providers (APPs -  Physician Assistants and Nurse Practitioners) who all work together to provide you with the care you need, when you need it.  We recommend signing up for the patient portal called "MyChart".  Sign up information is provided on this After Visit Summary.  MyChart is used to connect with patients for Virtual Visits (Telemedicine).  Patients are able to view lab/test results, encounter notes, upcoming appointments, etc.  Non-urgent messages can be sent to your provider as well.   To learn more about what you can do with MyChart, go to [email or web address].    Your next appointment:   1 year(s)  Provider:   Bryan Lemma, MD

## 2023-09-30 ENCOUNTER — Other Ambulatory Visit: Payer: Self-pay | Admitting: Cardiology

## 2023-09-30 LAB — COMPREHENSIVE METABOLIC PANEL
ALT: 17 [IU]/L (ref 0–44)
AST: 25 [IU]/L (ref 0–40)
Albumin: 4.5 g/dL (ref 3.9–4.9)
Alkaline Phosphatase: 87 [IU]/L (ref 44–121)
BUN/Creatinine Ratio: 16 (ref 10–24)
BUN: 15 mg/dL (ref 8–27)
Bilirubin Total: 1.2 mg/dL (ref 0.0–1.2)
CO2: 27 mmol/L (ref 20–29)
Calcium: 9.7 mg/dL (ref 8.6–10.2)
Chloride: 103 mmol/L (ref 96–106)
Creatinine, Ser: 0.92 mg/dL (ref 0.76–1.27)
Globulin, Total: 2.3 g/dL (ref 1.5–4.5)
Glucose: 101 mg/dL — ABNORMAL HIGH (ref 70–99)
Potassium: 5.1 mmol/L (ref 3.5–5.2)
Sodium: 141 mmol/L (ref 134–144)
Total Protein: 6.8 g/dL (ref 6.0–8.5)
eGFR: 90 mL/min/{1.73_m2} (ref >59)

## 2023-09-30 LAB — LIPID PANEL
Chol/HDL Ratio: 2.1 ratio (ref 0.0–5.0)
Cholesterol, Total: 105 mg/dL (ref 100–199)
HDL: 51 mg/dL (ref >39)
LDL Chol Calc (NIH): 37 mg/dL (ref 0–99)
Triglycerides: 88 mg/dL (ref 0–149)
VLDL Cholesterol Cal: 17 mg/dL (ref 5–40)

## 2023-10-26 ENCOUNTER — Encounter (INDEPENDENT_AMBULATORY_CARE_PROVIDER_SITE_OTHER): Payer: Self-pay | Admitting: *Deleted

## 2023-11-16 ENCOUNTER — Telehealth (INDEPENDENT_AMBULATORY_CARE_PROVIDER_SITE_OTHER): Payer: Self-pay | Admitting: Gastroenterology

## 2023-11-16 DIAGNOSIS — Z1211 Encounter for screening for malignant neoplasm of colon: Secondary | ICD-10-CM

## 2023-11-16 NOTE — Telephone Encounter (Signed)
Ok to schedule.  Room 2  Thanks,  Vista Lawman, MD Gastroenterology and Hepatology [facility] Gastroenterology

## 2023-11-16 NOTE — Telephone Encounter (Signed)
Who is your primary care physician: Dr.Stacks  Reasons for the colonoscopy: 5 year recall  Have you had a colonoscopy before?  yes  Do you have family history of colon cancer? no  Previous colonoscopy with polyps removed? no  Do you have a history colorectal cancer?   no  Are you diabetic? If yes, Type 1 or Type 2?    no  Do you have a prosthetic or mechanical heart valve? no  Do you have a pacemaker/defibrillator?   no  Have you had endocarditis/atrial fibrillation? no  Have you had joint replacement within the last 12 months?  no  Do you tend to be constipated or have to use laxatives? yes  Do you have any history of drugs or alchohol?  no  Do you use supplemental oxygen?  no  Have you had a stroke or heart attack within the last 6 months? no  Do you take weight loss medication?  no  Do you take any blood-thinning medications such as: (aspirin, warfarin, Plavix, Aggrenox)  yes  If yes we need the name, milligram, dosage and who is prescribing doctor yes Current Outpatient Medications on File Prior to Visit  Medication Sig Dispense Refill   amLODipine (NORVASC) 5 MG tablet TAKE 1 TABLET (5 MG TOTAL) BY MOUTH DAILY. 90 tablet 3   aspirin 81 MG tablet Take 81 mg by mouth daily.     ezetimibe (ZETIA) 10 MG tablet TAKE 1 TABLET BY MOUTH EVERY DAY 90 tablet 3   hydrochlorothiazide (MICROZIDE) 12.5 MG capsule Take 1 capsule (12.5 mg total) by mouth daily. Please keep upcoming Oct apt for further refills. Thank you. 90 capsule 0   nitroGLYCERIN (NITROSTAT) 0.4 MG SL tablet Place 1 tablet (0.4 mg total) under the tongue every 5 (five) minutes as needed for chest pain. 25 tablet 6   Prucalopride Succinate (MOTEGRITY) 2 MG TABS Take 1 tablet (2 mg total) by mouth daily. 30 tablet 2   rosuvastatin (CRESTOR) 40 MG tablet TAKE 1 TABLET BY MOUTH EVERY DAY 90 tablet 3   vitamin E 400 UNIT capsule Take 400 Units by mouth daily.     No current facility-administered medications on file  prior to visit.    No Known Allergies   Pharmacy: [facility] Insurance Name: [facility]  Best number where you can be reached: [phone]

## 2023-11-27 NOTE — Telephone Encounter (Signed)
Contact patient but pt was not home. Spoke to wife Kathie Rhodes. Their daughter is in ICU at this time. Wife asked that I call back later to get patient scheduled.

## 2023-12-19 ENCOUNTER — Other Ambulatory Visit: Payer: Self-pay | Admitting: Cardiology

## 2024-01-13 MED ORDER — PEG 3350-KCL-NA BICARB-NACL 420 G PO SOLR: 4000.0000 mL | Freq: Once | ORAL | 0 refills | Status: AC

## 2024-01-13 NOTE — Addendum Note (Signed)
Addended by: Marlowe Shores on: 01/13/2024 12:14 PM   Modules accepted: Orders

## 2024-01-13 NOTE — Telephone Encounter (Signed)
Left message to return call

## 2024-01-13 NOTE — Telephone Encounter (Signed)
Pt returned call. Pt scheduled for 01/20/24 at 10:00am. Instructions mailed to patient. Prep sent to pharmacy. No pa needed

## 2024-01-14 ENCOUNTER — Other Ambulatory Visit (HOSPITAL_COMMUNITY)
Admission: RE | Admit: 2024-01-14 | Discharge: 2024-01-14 | Disposition: A | Discharge status: Home / Self Care | Payer: Medicare Other | Source: Ambulatory Visit | Attending: Gastroenterology | Admitting: Gastroenterology

## 2024-01-14 DIAGNOSIS — Z1211 Encounter for screening for malignant neoplasm of colon: Secondary | ICD-10-CM | POA: Insufficient documentation

## 2024-01-14 LAB — BASIC METABOLIC PANEL
Anion gap: 9 (ref 5–15)
BUN: 18 mg/dL (ref 8–23)
CO2: 28 mmol/L (ref 22–32)
Calcium: 9.8 mg/dL (ref 8.9–10.3)
Chloride: 102 mmol/L (ref 98–111)
Creatinine, Ser: 1 mg/dL (ref 0.61–1.24)
GFR, Estimated: >60 mL/min (ref >60)
Glucose, Bld: 85 mg/dL (ref 70–99)
Potassium: 4.4 mmol/L (ref 3.5–5.1)
Sodium: 139 mmol/L (ref 135–145)

## 2024-01-14 NOTE — Telephone Encounter (Signed)
Questionnaire from recall, no referral needed

## 2024-01-18 ENCOUNTER — Telehealth (INDEPENDENT_AMBULATORY_CARE_PROVIDER_SITE_OTHER): Payer: Self-pay | Admitting: Gastroenterology

## 2024-01-18 NOTE — Telephone Encounter (Signed)
 Page, Lisa Roca, RN  Marlowe Shores, LPN; Marguerite Olea S Pt called and wants to reschedule his colonoscopy due to the weather that they are calling for on Wednesday. He is scheduled Wednesday, 01/20/2024. He also stated he did not receive any instructions, he does not use mychart and does not have an email. Confirmed mailing address on file and it is accurate. Could you please resend his instructions as well when he is rescheduled. Thank you!  Pt contacted but spouse answered. Pt is out golfing and will have to call back.

## 2024-01-19 NOTE — Telephone Encounter (Signed)
 Pt returned call and is now scheduled for 02/11/24 at 9:45am. Pt has prep. Will mail updated instructions.

## 2024-01-20 ENCOUNTER — Ambulatory Visit (HOSPITAL_COMMUNITY): Admission: RE | Admit: 2024-01-20 | Payer: Medicare Other | Source: Home / Self Care | Admitting: Gastroenterology

## 2024-01-22 ENCOUNTER — Ambulatory Visit: Payer: Medicare Other | Attending: Cardiology | Admitting: Cardiology

## 2024-01-22 ENCOUNTER — Encounter: Payer: Self-pay | Admitting: Cardiology

## 2024-01-22 ENCOUNTER — Ambulatory Visit (INDEPENDENT_AMBULATORY_CARE_PROVIDER_SITE_OTHER): Payer: Medicare Other

## 2024-01-22 ENCOUNTER — Telehealth: Payer: Self-pay | Admitting: Cardiology

## 2024-01-22 VITALS — BP 124/72 | HR 60 | Ht 68.0 in | Wt 181.6 lb

## 2024-01-22 DIAGNOSIS — I2111 ST elevation (STEMI) myocardial infarction involving right coronary artery: Secondary | ICD-10-CM

## 2024-01-22 DIAGNOSIS — R739 Hyperglycemia, unspecified: Secondary | ICD-10-CM | POA: Diagnosis not present

## 2024-01-22 DIAGNOSIS — R002 Palpitations: Secondary | ICD-10-CM

## 2024-01-22 DIAGNOSIS — I252 Old myocardial infarction: Secondary | ICD-10-CM

## 2024-01-22 DIAGNOSIS — I10 Essential (primary) hypertension: Secondary | ICD-10-CM

## 2024-01-22 DIAGNOSIS — E785 Hyperlipidemia, unspecified: Secondary | ICD-10-CM

## 2024-01-22 DIAGNOSIS — R0602 Shortness of breath: Secondary | ICD-10-CM

## 2024-01-22 DIAGNOSIS — R0609 Other forms of dyspnea: Secondary | ICD-10-CM | POA: Diagnosis not present

## 2024-01-22 DIAGNOSIS — I25119 Atherosclerotic heart disease of native coronary artery with unspecified angina pectoris: Secondary | ICD-10-CM

## 2024-01-22 DIAGNOSIS — R072 Precordial pain: Secondary | ICD-10-CM

## 2024-01-22 MED ORDER — METOPROLOL TARTRATE 50 MG PO TABS: 50.0000 mg | ORAL_TABLET | Freq: Once | ORAL | 0 refills | Status: DC

## 2024-01-22 NOTE — Telephone Encounter (Signed)
 Spoke to patient he stated he has been having cramping in chest.He tires easy and gets sob.He also is having palpitations.He has been having symptoms for 1 year.Symptoms getting more frequent and worse.Appointment scheduled with Dr.Harding today at 3:20 pm.

## 2024-01-22 NOTE — Telephone Encounter (Signed)
 Patient c/o Palpitations:  STAT if patient reporting lightheadedness, shortness of breath, or chest pain  How long have you had palpitations/irregular HR/ Afib? Are you having the symptoms now?   No  Are you currently experiencing lightheadedness, SOB or CP?   SOB when he exerts himself  Do you have a history of afib (atrial fibrillation) or irregular heart rhythm?   No  Have you checked your BP or HR? (document readings if available):  No  Are you experiencing any other symptoms?   No  Patient is concerned he feels like a "fluttering"/"cramp" in his chest every now and then.  Patient wants advice on next steps.

## 2024-01-22 NOTE — Patient Instructions (Addendum)
 Medication Instructions:   See below - one time dose of Metoprolol tartrate 50 mg   *If you need a refill on your cardiac medications before your next appointment, please call your pharmacy*   Lab Work: Not needed .   Testing/Procedures:  Will  be schedule at [facility] [address] Your physician has requested that you have an echocardiogram. Echocardiography is a painless test that uses sound waves to create images of your heart. It provides your doctor with information about the size and shape of your heart and how well your heart's chambers and valves are working. This procedure takes approximately one hour. There are no restrictions for this procedure. Please do NOT wear cologne, perfume, aftershave, or lotions (deodorant is allowed). Please arrive 15 minutes prior to your appointment time.  Please note: We ask at that you not bring children with you during ultrasound (echo/ vascular) testing. Due to room size and safety concerns, children are not allowed in the ultrasound rooms during exams. Our front office staff cannot provide observation of children in our lobby area while testing is being conducted. An adult accompanying a patient to their appointment will only be allowed in the ultrasound room at the discretion of the ultrasound technician under special circumstances. We apologize for any inconvenience.  and Will be mailed to your home 3 to 7 days  Your physician has recommended that you wear a holter monitor 7 day  ZIO. Holter monitors are medical devices that record the heart's electrical activity. Doctors most often use these monitors to diagnose arrhythmias. Arrhythmias are problems with the speed or rhythm of the heartbeat. The monitor is a small, portable device. You can wear one while you do your normal daily activities. This is usually used to diagnose what is causing palpitations/syncope (passing out).  Follow-Up: At [facility], you and your health needs are  our priority.  As part of our continuing mission to provide you with exceptional heart care, we have created designated Provider Care Teams.  These Care Teams include your primary Cardiologist (physician) and Advanced Practice Providers (APPs -  Physician Assistants and Nurse Practitioners) who all work together to provide you with the care you need, when you need it.     Your next appointment:   2 month(s)  The format for your next appointment:   In Person  Provider:   Bryan Lemma, MD    Other Instructions  ZIO XT- Long Term Monitor Instructions  Your physician has requested you wear a ZIO patch monitor for 14 days.  This is a single patch monitor. [employer] supplies one patch monitor per enrollment. Additional stickers are not available. Please do not apply patch if you will be having a Nuclear Stress Test,  Echocardiogram, Cardiac CT, MRI, or Chest Xray during the period you would be wearing the  monitor. The patch cannot be worn during these tests. You cannot remove and re-apply the  ZIO XT patch monitor.  Your ZIO patch monitor will be mailed 3 day USPS to your address on file. It may take 3-5 days  to receive your monitor after you have been enrolled.  Once you have received your monitor, please review the enclosed instructions. Your monitor  has already been registered assigning a specific monitor serial # to you.  Billing and Patient Assistance Program Information  We have supplied [employer] with any of your insurance information on file for billing purposes. [employer] offers a sliding scale Patient Assistance Program for patients that do  not have  insurance, or whose insurance does not completely cover the cost of the ZIO monitor.  You must apply for the Patient Assistance Program to qualify for this discounted rate.  To apply, please call [employer] at [phone], select option 4, select option 2, ask to apply for  Patient Assistance Program. Meredeth Ide will ask your household  income, and how many people  are in your household. They will quote your out-of-pocket cost based on that information.  [employer] will also be able to set up a 63-month, interest-free payment plan if needed.  Applying the monitor   Shave hair from upper left chest.  Hold abrader disc by orange tab. Rub abrader in 40 strokes over the upper left chest as  indicated in your monitor instructions.  Clean area with 4 enclosed alcohol pads. Let dry.  Apply patch as indicated in monitor instructions. Patch will be placed under collarbone on left  side of chest with arrow pointing upward.  Rub patch adhesive wings for 2 minutes. Remove white label marked "1". Remove the white  label marked "2". Rub patch adhesive wings for 2 additional minutes.  While looking in a mirror, press and release button in center of patch. A small green light will  flash 3-4 times. This will be your only indicator that the monitor has been turned on.  Do not shower for the first 24 hours. You may shower after the first 24 hours.  Press the button if you feel a symptom. You will hear a small click. Record Date, Time and  Symptom in the Patient Logbook.  When you are ready to remove the patch, follow instructions on the last 2 pages of Patient  Logbook. Stick patch monitor onto the last page of Patient Logbook.  Place Patient Logbook in the blue and white box. Use locking tab on box and tape box closed  securely. The blue and white box has prepaid postage on it. Please place it in the mailbox as  soon as possible. Your physician should have your test results approximately 7 days after the  monitor has been mailed back to [facility].  Call [facility] Customer Care at [phone] if you have questions regarding  your ZIO XT patch monitor. Call them immediately if you see an orange light blinking on your  monitor.  If your monitor falls off in less than 4 days, contact our Monitor department at [phone].  If your  monitor becomes loose or falls off after 4 days call [employer] at [phone] for  suggestions on securing your monitor        Your cardiac CT will be scheduled at one of the below locations:   [facility] [address] [place], [place] [address] [phone]  OR  [facility] [address] [place], [place] [address] [phone]  OR   [facility] [address] [place], [place] [address] [phone]  OR   [facility] [address] [place], [place] [address] [phone]  If scheduled at [facility], please arrive at the [facility] and Children's Entrance (Entrance C2) of [facility] 30 minutes prior to test start time. You can use the FREE valet parking offered at entrance C (encouraged to control the heart rate for the test)  Proceed to the [facility] Radiology Department (first floor) to check-in and test prep.  All radiology patients and guests should use entrance C2 at [facility],  accessed from [facility], even though the hospital's physical address listed is [address].    If scheduled at [facility] or [facility], please arrive 15 mins early for check-in and test prep.  There is spacious parking and easy access to the radiology department from the [facility] entrance. Please enter here and check-in with the desk attendant.   If scheduled at [facility], please arrive 30 minutes early for check-in and test prep.  Please follow these instructions carefully (unless otherwise directed):  An IV will be required for this test and Nitroglycerin will be given.  Hold all erectile dysfunction medications at least 3 days (72 hrs) prior to test. (Ie viagra, cialis, sildenafil, tadalafil, etc)   On the Night Before the Test: Be sure to Drink plenty of  water. Do not consume any caffeinated/decaffeinated beverages or chocolate 12 hours prior to your test. Do not take any antihistamines 12 hours prior to your test.   On the Day of the Test: Drink plenty of water until 1 hour prior to the test. Do not eat any food 1 hour prior to test. You may take your regular medications prior to the test.  Take metoprolol (Lopressor)50 mg  two hours prior to test. Hydrochlorothiazide, please HOLD on the morning of the test.          After the Test: Drink plenty of water. After receiving IV contrast, you may experience a mild flushed feeling. This is normal. On occasion, you may experience a mild rash up to 24 hours after the test. This is not dangerous. If this occurs, you can take Benadryl 25 mg, Zyrtec, Claritin, or Allegra and increase your fluid intake. (Patients taking Tikosyn should avoid Benadryl, and may take Zyrtec, Claritin, or Allegra) If you experience trouble breathing, this can be serious. If it is severe call 911 IMMEDIATELY. If it is mild, please call our office.  We will call to schedule your test 2-4 weeks out understanding that some insurance companies will need an authorization prior to the service being performed.   For more information and frequently asked questions, please visit our website : [email or web address]  For non-scheduling related questions, please contact the cardiac imaging nurse navigator should you have any questions/concerns: Cardiac Imaging Nurse Navigators Direct Office Dial: [phone]   For scheduling needs, including cancellations and rescheduling, please call [place], [phone].

## 2024-01-22 NOTE — Progress Notes (Signed)
 Cardiology Office Note:  .   Date:  01/23/2024  ID:  Omar [name], DOB 1956-11-07, MRN [id number] PCP: Mechele Claude, MD  [facility] Providers Cardiologist:  Bryan Lemma, MD     Chief Complaint  Patient presents with   Shortness of Breath    Patient stated that when he walks long distance he gets Sob and up hills and up/down steps   Follow-up    Annual follow-up   Coronary Artery Disease    History of inferior infarct with RCA PCI.  Now having shortness of breath with occasional chest discomfort    Patient Profile: .     Omar [name] is a 68 y.o. male with a PMH notable for HTN, CAD, hx of MI, OSA, AS who presents here for worsening shortness of breath on exertion and new onset fluttering at the request of Mechele Claude, MD.    Omar [name] was last seen on 09/29/23 with Marjie Skiff on September 29, 2023 for routine follow-up.  He was doing well.  Noting occasional atypical chest discomfort described as a whooshing sensation across his chest but no exertional chest pain.  No exertional dyspnea.  He did note being unable to tolerate CPAP.  Stable symptoms.  Labs look great.  No changes made.  Plan was 1 year follow-up.  Subjective  Resident:  Palpitations: New onset in the last few months.  Described as fluttering in his chest.  Symptoms last for a few seconds.  Did not seem to be related to exertion or activities.  He reports having multiple episodes of flutters a day.  He denies associated chest pain or shortness of breath during this symptom.  He has not fallen or had LOC.  Worsening SOB: Over the last year patient reports it has been harder to exert himself.  He recently went hunting with his son and was unable to keep up when he would have been able to a year ago.  He reports he had to stop multiple times to catch his breath.  He endorses some chest squeezing and tightness during episodes of shortness of breath.  HTN: hydrochlorothiazide 12.5 mg,  amlodipine 5 mg.  Blood pressure is well-controlled and he denies side effects of medication   CAD: Crestor 40 mg, Zetia 10 mg, ASA: Compliant with medications and denies side effects.   Cardiovascular ROS: positive for - dyspnea on exertion, palpitations, and shortness of breath  ATTENDING HPI Discussed the use of AI scribe software for clinical note transcription with the patient, who gave verbal consent to proceed.  History of Present Illness   Omar [name] is a 68 year old male with a history of myocardial infarction who presents with increased shortness of breath on exertion and palpitations.  He experiences increased shortness of breath during exertion, such as walking uphill. No orthopnea, paroxysmal nocturnal dyspnea, dizziness, syncope, or chest tightness during exertion. He denies any peripheral edema.  He describes a sensation of 'fluttering' in his chest, occurring sporadically and lasting briefly. This sensation is not accompanied by chest tightness or pain similar to his myocardial infarction in 1997.  His past medical history includes a myocardial infarction in 1997, treated with angioplasty without stent placement. He had a follow-up heart catheterization a year later as part of a study. He has undergone two stress tests since then, with the last one in 2009, which was normal. He has not had an echocardiogram since 2014. He denies any recent episodes of chest pain similar  to his previous heart attack, which he described as feeling like 'a Mack truck' on his chest.       ROS:  Review of Systems - Negative except as above    Objective    Studies Reviewed: Marland Kitchen   EKG Interpretation Date/Time:  Friday January 22 2024 15:42:11 EST Ventricular Rate:  60 PR Interval:  152 QRS Duration:  82 QT Interval:  402 QTC Calculation: 402 R Axis:   34  Text Interpretation: Normal sinus rhythm Normal ECG When compared with ECG of 29-Sep-2023 08:00, No significant change was  found Confirmed by Bryan Lemma ([id number]) on 01/22/2024 4:24:21 PM    ECHO (Jan 2014) LVEF of 55-65% with mild LVH  CATH: 1998 inferior STEMI in 05/1996. Cardiac catheterization at that time showed occluded RCA which was successful treated with PTCA/ POBA (no stenting). Re-look cath in 12/1996 showed minimal residual stenosis at the PTCA site  Myoview (05/2008): EF 58%.  No ischemia.  Exercised 8 METS; ? Fixed Anterior defect ? C/w artifact.   Labs personally reviewed in epic.  Ordered by PCP. Lab Results  Component Value Date   CHOL 105 09/29/2023   HDL 51 09/29/2023   LDLCALC 37 09/29/2023   TRIG 88 09/29/2023   CHOLHDL 2.1 09/29/2023   Lab Results  Component Value Date   NA 139 01/14/2024   K 4.4 01/14/2024   CREATININE 1.00 01/14/2024   GFRNONAA >60 01/14/2024   GLUCOSE 85 01/14/2024   Lab Results  Component Value Date   HGBA1C 6.0 (H) 09/24/2022    Risk Assessment/Calculations:             Physical Exam:   VS:  BP 124/72 (Cuff Size: Normal)   Pulse 60   Ht 5\' 8"  (1.727 m)   Wt 181 lb 9.6 oz (82.4 kg)   SpO2 96%   BMI 27.61 kg/m    Wt Readings from Last 3 Encounters:  01/22/24 181 lb 9.6 oz (82.4 kg)  09/29/23 178 lb 3.2 oz (80.8 kg)  09/24/22 178 lb 12.8 oz (81.1 kg)    Physical examination performed and reported by Attending GEN: Well nourished, well developed in no acute distress; ambulates unassisted  NECK: No JVD; No carotid bruits CARDIAC: Normal S1, S2; RRR, no murmurs, rubs, gallops (on my exam I heard a soft 1/6 SEM at RUSB) RESPIRATORY:  Clear to auscultation without rales, wheezing or rhonchi ; nonlabored, good air movement. ABDOMEN: Soft, non-tender, non-distended, non-incarcerated ventral hernia present EXTREMITIES:  No edema; No deformity     ASSESSMENT AND PLAN: .    Problem List Items Addressed This Visit       Cardiology Problems   Coronary artery disease involving native coronary artery of native heart with angina pectoris (HCC) (Chronic)    Single-vessel disease status post PTCA of the RCA in 1998 now having more concerning symptoms of precordial pain and exertional dyspnea concerning for possible progression of CAD. Currently on what has been a stable regimen of amlodipine 5 mg daily for antianginal benefit along with HCTZ for BP control.  He is also on aspirin 81 mg along with rosuvastatin 40 mg daily and Zetia 10 mg daily for lipids. -Continue current meds for now pending evaluation for ischemia and change in EF. -Will order 2D echo and coronary CTA to evaluate for new onset exertional dyspnea and precordial pain      Relevant Orders   CT CORONARY MORPH W/CTA COR W/SCORE W/CA W/CM &/OR WO/CM   ECHOCARDIOGRAM COMPLETE  LONG TERM MONITOR (3-14 DAYS)   Essential hypertension (Chronic)   (Resident) hypertension: Continue amlodipine 5 mg and hydrochlorothiazide 12.5 mg.  Blood pressure at goal less than 140/90.   BP well-controlled on current doses of amlodipine 5 mg daily and HCTZ 12.5 mg daily. -Continue current meds.      Relevant Orders   EKG 12-Lead (Completed)   CT CORONARY MORPH W/CTA COR W/SCORE W/CA W/CM &/OR WO/CM   ECHOCARDIOGRAM COMPLETE   Hyperlipidemia with target low density lipoprotein (LDL) cholesterol less than 55 mg/dL (Chronic)   Hyperlipidemia (Resident): Continue Crestor and Zetia.  LDL goal less than 50  Outstanding lipid control with LDL 37 based on labs in October 2024. -Continue current dose of 40 mg rosuvastatin +10 mg Zetia -Labs being followed by PCP.      RESOLVED: Myocardial infarction (HCC) (Chronic)   Stable angina (HCC) (Chronic)   Precordial chest pain  Increased SOB (Resident) With patient's history of STEMI and progressive shortness of breath with exertion in the past year leaves concern for worsening CAD. Will need coronary CTA to assess coronary arteries.  Will also need to update echocardiogram to assess for CHF.  LDL within goal of less than 50 continue Crestor and Zetia.   Continue aspirin.  History of myocardial infarction in 1997. New onset of shortness of breath with exertion and occasional fluttering sensation. No chest pain, shortness of breath at rest, or orthopnea. No dizziness or syncope. -Order a 1-week heart monitor to evaluate for possible atrial fibrillation or other arrhythmias. -Order 2D echocardiogram to reassess EF and wall motion. -Order a coronary CT angiogram to evaluate for possible coronary artery disease given new symptoms and history of myocardial infarction.        Other   Elevated blood sugar level (Chronic)   Hemoglobin A1c back in 2023 was 6.0.  Would recommend continue to monitor.      History of acute inferior wall MI (Chronic)   He has a history of inferior infarct with RCA PTCA in 1998.  There was no suggestion of abnormal inferior wall motion abnormality on follow-up echocardiogram. Also no evidence of inferior infarct on previous Myoview. With his previous Myoview having some concern for artifact, have chosen to proceed with Coronary CTA for sleep evaluation since he does not have a stent.      Palpitations   Relevant Orders   LONG TERM MONITOR (3-14 DAYS)   Other Visit Diagnoses       Precordial pain    -  Primary   Relevant Orders   CT CORONARY MORPH W/CTA COR W/SCORE W/CA W/CM &/OR WO/CM   ECHOCARDIOGRAM COMPLETE     Shortness of breath       Relevant Orders   CT CORONARY MORPH W/CTA COR W/SCORE W/CA W/CM &/OR WO/CM   ECHOCARDIOGRAM COMPLETE      General Health Maintenance Medications appear to be well managed with good control of blood pressure and cholesterol. -Continue current medications. -Return for follow-up in a couple of months or sooner if coronary CT angiogram or heart monitor results are abnormal.         Informed Consent   Pending results of Coronary CTA we discussed proceed with cardiac catheterization. Shared Decision Making/Informed Consent The risks [stroke (1 in 1000), death (1 in  1000), kidney failure [usually temporary] (1 in 500), bleeding (1 in 200), allergic reaction [possibly serious] (1 in 200)], benefits (diagnostic support and management of coronary artery disease) and alternatives of a cardiac catheterization  were discussed in detail with Mr. Chisolm and he is willing to proceed.      Follow-Up: Return in about 2 months (around 03/21/2024) for Routine Follow-up after testing ~ 1-2 months.   Signed, Glendale Chard, DO (FM R2) -resident physician  ATTENDING ATTESTATION  I have seen, examined and evaluated the patient along with the Resident Physician in clinic today.  I personally performed my own interview & exanimation.  After reviewing all the available data and chart, we discussed the patients laboratory, study & physical findings as well as symptoms in detail. I agree with her findings, examination as well as impression recommendations as per our discussion.    Attending adjustments int the full clinic noted annotated in italics.    Marykay Lex, MD, MS Bryan Lemma, M.D., M.S. Interventional Cardiologist  [facility]  Pager # [phone] Phone # [phone] [address] [place], [place] [address]

## 2024-01-22 NOTE — Progress Notes (Unsigned)
 Enrolled patient for a 7 day Zio XT monitor to be mailed to patients home.

## 2024-01-23 ENCOUNTER — Encounter: Payer: Self-pay | Admitting: Cardiology

## 2024-01-23 DIAGNOSIS — R072 Precordial pain: Secondary | ICD-10-CM | POA: Insufficient documentation

## 2024-01-23 DIAGNOSIS — R002 Palpitations: Secondary | ICD-10-CM | POA: Insufficient documentation

## 2024-01-23 DIAGNOSIS — R0609 Other forms of dyspnea: Secondary | ICD-10-CM | POA: Insufficient documentation

## 2024-01-23 NOTE — Assessment & Plan Note (Signed)
 He has a history of inferior infarct with RCA PTCA in 1998.  There was no suggestion of abnormal inferior wall motion abnormality on follow-up echocardiogram. Also no evidence of inferior infarct on previous Myoview. With his previous Myoview having some concern for artifact, have chosen to proceed with Coronary CTA for sleep evaluation since he does not have a stent.

## 2024-01-23 NOTE — Assessment & Plan Note (Deleted)
 This history of inferior infarct with RCA PTCA in 1998.  There was no suggestion of abnormal inferior wall motion abnormality on follow-up echocardiogram. Also no evidence of inferior infarct on previous Myoview. With his previous Myoview having some concern for artifact, have chosen to proceed with Coronary CTA for sleep evaluation since he does not have a stent.

## 2024-01-23 NOTE — Assessment & Plan Note (Signed)
 Single-vessel disease status post PTCA of the RCA in 1998 now having more concerning symptoms of precordial pain and exertional dyspnea concerning for possible progression of CAD. Currently on what has been a stable regimen of amlodipine 5 mg daily for antianginal benefit along with HCTZ for BP control.  He is also on aspirin 81 mg along with rosuvastatin 40 mg daily and Zetia 10 mg daily for lipids. -Continue current meds for now pending evaluation for ischemia and change in EF. -Will order 2D echo and coronary CTA to evaluate for new onset exertional dyspnea and precordial pain

## 2024-01-23 NOTE — Assessment & Plan Note (Signed)
(  Resident) hypertension: Continue amlodipine 5 mg and hydrochlorothiazide 12.5 mg.  Blood pressure at goal less than 140/90.   BP well-controlled on current doses of amlodipine 5 mg daily and HCTZ 12.5 mg daily. -Continue current meds.

## 2024-01-23 NOTE — Progress Notes (Signed)
 Cardiology Office Note:  .   Date:  01/23/2024  ID:  Omar [name], DOB Apr 09, 1956, MRN [id number] PCP: Mechele Claude, MD  [facility] Providers Cardiologist:  Bryan Lemma, MD     Chief Complaint  Patient presents with   Shortness of Breath    Patient stated that when he walks long distance he gets Sob and up hills and up/down steps   Follow-up    Annual follow-up   Coronary Artery Disease    History of inferior infarct with RCA PCI.  Now having shortness of breath with occasional chest discomfort    Patient Profile: .     Omar [name] is a  68 y.o. male with a PMH notable for longstanding CAD with inferior STEMI in 1998 (RCA PTCA as part of PAMI trial), along with HTN and HLD who presents here for 56-month follow-up at the request of Mechele Claude, MD.     Omar [name] was last seen on Omar [name], [place] on September 29, 2023 for routine follow-up.  He was doing well.  Noting occasional atypical chest discomfort described as a whooshing sensation across his chest but no exertional chest pain.  No exertional dyspnea.  He did note being unable to tolerate CPAP.  Stable symptoms.  Labs look great.  No changes made.  Plan was 1 year follow-up.  Subjective  Discussed the use of AI scribe software for clinical note transcription with the patient, who gave verbal consent to proceed.  History of Present Illness   Omar [name] is a 68 year old male with a history of myocardial infarction who presents with increased shortness of breath on exertion and palpitations.  He experiences increased shortness of breath during exertion, such as walking uphill. No orthopnea, paroxysmal nocturnal dyspnea, dizziness, syncope, or chest tightness during exertion. He denies any peripheral edema.  He describes a sensation of 'fluttering' in his chest, occurring sporadically and lasting briefly. This sensation is not accompanied by chest tightness or pain similar to his  myocardial infarction in 1997.  His past medical history includes a myocardial infarction in 1997, treated with angioplasty without stent placement. He had a follow-up heart catheterization a year later as part of a study. He has undergone two stress tests since then, with the last one in 2009, which was normal. He has not had an echocardiogram since 2014. He denies any recent episodes of chest pain similar to his previous heart attack, which he described as feeling like 'a Mack truck' on his chest.        Objective    Studies Reviewed: Omar [name]   EKG Interpretation Date/Time:  Friday January 22 2024 15:42:11 EST Ventricular Rate:  60 PR Interval:  152 QRS Duration:  82 QT Interval:  402 QTC Calculation: 402 R Axis:   34  Text Interpretation: Normal sinus rhythm Normal ECG When compared with ECG of 29-Sep-2023 08:00, No significant change was found Confirmed by Bryan Lemma ([id number]) on 01/22/2024 4:24:21 PM    See note with the resident for additional notes   Physical Exam:   VS:  BP 124/72 (Cuff Size: Normal)   Pulse 60   Ht 5\' 8"  (1.727 m)   Wt 181 lb 9.6 oz (82.4 kg)   SpO2 96%   BMI 27.61 kg/m    Wt Readings from Last 3 Encounters:  01/22/24 181 lb 9.6 oz (82.4 kg)  09/29/23 178 lb 3.2 oz (80.8 kg)  09/24/22 178 lb 12.8 oz (81.1 kg)  GEN: Well nourished, well developed in no acute distress; healthy-appearing.  Well-groomed. NECK: No JVD; No carotid bruits CARDIAC: Normal S1, S2; RRR, no  rubs, gallops; soft 1/6 SEM at RUSB RESPIRATORY:  Clear to auscultation without rales, wheezing or rhonchi ; nonlabored, good air movement. ABDOMEN: Soft, non-tender, non-distended EXTREMITIES:  No edema; No deformity      ASSESSMENT AND PLAN: .    Problem List Items Addressed This Visit       Cardiology Problems   Coronary artery disease involving native coronary artery of native heart with angina pectoris (HCC) (Chronic)   Single-vessel disease status post PTCA of the RCA in  1998 now having more concerning symptoms of precordial pain and exertional dyspnea concerning for possible progression of CAD. Currently on what has been a stable regimen of amlodipine 5 mg daily for antianginal benefit along with HCTZ for BP control.  He is also on aspirin 81 mg along with rosuvastatin 40 mg daily and Zetia 10 mg daily for lipids. -Continue current meds for now pending evaluation for ischemia and change in EF. -Will order 2D echo and coronary CTA to evaluate for new onset exertional dyspnea and precordial pain      Relevant Orders   CT CORONARY MORPH W/CTA COR W/SCORE W/CA W/CM &/OR WO/CM   ECHOCARDIOGRAM COMPLETE   LONG TERM MONITOR (3-14 DAYS)   Essential hypertension (Chronic)   (Resident) hypertension: Continue amlodipine 5 mg and hydrochlorothiazide 12.5 mg.  Blood pressure at goal less than 140/90.   BP well-controlled on current doses of amlodipine 5 mg daily and HCTZ 12.5 mg daily. -Continue current meds.      Relevant Orders   EKG 12-Lead (Completed)   CT CORONARY MORPH W/CTA COR W/SCORE W/CA W/CM &/OR WO/CM   ECHOCARDIOGRAM COMPLETE   Hyperlipidemia with target low density lipoprotein (LDL) cholesterol less than 55 mg/dL (Chronic)   Hyperlipidemia (Resident): Continue Crestor and Zetia.  LDL goal less than 50  Outstanding lipid control with LDL 37 based on labs in October 2024. -Continue current dose of 40 mg rosuvastatin +10 mg Zetia -Labs being followed by PCP.      RESOLVED: Myocardial infarction (HCC) (Chronic)     Other   Dyspnea on exertion   New onset of shortness of breath with exertion and occasional fluttering sensation.  No resting chest pain, but does note some discomfort (precordial pain) with a fluttering sensations and dyspnea on exertion.   Shortness of breath at rest, or orthopnea. No dizziness or syncope. -Order 2D echocardiogram to assess for new wall motion abnormalities, or reduced EF -Order a coronary CT angiogram to evaluate for  possible coronary artery disease given new symptoms and history of myocardial infarction.      Relevant Orders   CT CORONARY MORPH W/CTA COR W/SCORE W/CA W/CM &/OR WO/CM   ECHOCARDIOGRAM COMPLETE   Elevated blood sugar level (Chronic)   Hemoglobin A1c back in 2023 was 6.0.  Would recommend continue to monitor.      History of acute inferior wall MI (Chronic)   He has a history of inferior infarct with RCA PTCA in 1998.  There was no suggestion of abnormal inferior wall motion abnormality on follow-up echocardiogram. Also no evidence of inferior infarct on previous Myoview. With his previous Myoview having some concern for artifact, have chosen to proceed with Coronary CTA for sleep evaluation since he does not have a stent.      Palpitations   Relevant Orders   LONG TERM MONITOR (3-14  DAYS)   Precordial pain - Primary   Precordial chest pain  Increased SOB (Resident) With patient's history of STEMI and progressive shortness of breath with exertion in the past year leaves concern for worsening CAD. Will need coronary CTA to assess coronary arteries.  Will also need to update echocardiogram to assess for CHF.  LDL within goal of less than 50 continue Crestor and Zetia.  Continue aspirin.  History of myocardial infarction in 1997. New onset of shortness of breath with exertion and occasional fluttering sensation. No chest pain, shortness of breath at rest, or orthopnea. No dizziness or syncope. -Order a 1-week heart monitor to evaluate for possible atrial fibrillation or other arrhythmias. -Order 2D echocardiogram to reassess EF and wall motion. -Order a coronary CT angiogram to evaluate for possible coronary artery disease given new symptoms and history of myocardial infarction.      Relevant Orders   CT CORONARY MORPH W/CTA COR W/SCORE W/CA W/CM &/OR WO/CM   ECHOCARDIOGRAM COMPLETE     General Health Maintenance Medications appear to be well managed with good control of blood  pressure and cholesterol. -Continue current medications. -Return for follow-up in a couple of months or sooner if coronary CT angiogram or heart monitor results are abnormal.      Follow-Up: Return in about 2 months (around 03/21/2024) for Routine Follow-up after testing ~ 1-2 months.    Signed, Marykay Lex, MD, MS Bryan Lemma, M.D., M.S. Interventional Cardiologist  [facility]  Pager # [phone] Phone # [phone] [address] [place], [place] [address]

## 2024-01-23 NOTE — Assessment & Plan Note (Signed)
 Precordial chest pain  Increased SOB (Resident) With patient's history of STEMI and progressive shortness of breath with exertion in the past year leaves concern for worsening CAD. Will need coronary CTA to assess coronary arteries.  Will also need to update echocardiogram to assess for CHF.  LDL within goal of less than 50 continue Crestor and Zetia.  Continue aspirin.  History of myocardial infarction in 1997. New onset of shortness of breath with exertion and occasional fluttering sensation. No chest pain, shortness of breath at rest, or orthopnea. No dizziness or syncope. -Order a 1-week heart monitor to evaluate for possible atrial fibrillation or other arrhythmias. -Order 2D echocardiogram to reassess EF and wall motion. -Order a coronary CT angiogram to evaluate for possible coronary artery disease given new symptoms and history of myocardial infarction.

## 2024-01-23 NOTE — Assessment & Plan Note (Deleted)
 Precordial chest pain  Increased SOB (Resident) With patient's history of STEMI and progressive shortness of breath with exertion in the past year leaves concern for worsening CAD. Will need coronary CTA to assess coronary arteries.  Will also need to update echocardiogram to assess for CHF.  LDL within goal of less than 50 continue Crestor and Zetia.  Continue aspirin.  History of myocardial infarction in 1997. New onset of shortness of breath with exertion and occasional fluttering sensation. No chest pain, shortness of breath at rest, or orthopnea. No dizziness or syncope. -Order a 1-week heart monitor to evaluate for possible atrial fibrillation or other arrhythmias. -Order 2D echocardiogram to reassess EF and wall motion. -Order a coronary CT angiogram to evaluate for possible coronary artery disease given new symptoms and history of myocardial infarction.

## 2024-01-23 NOTE — Assessment & Plan Note (Addendum)
 Hyperlipidemia (Resident): Continue Crestor and Zetia.  LDL goal less than 50  Outstanding lipid control with LDL 37 based on labs in October 2024. -Continue current dose of 40 mg rosuvastatin +10 mg Zetia -Labs being followed by PCP.

## 2024-01-23 NOTE — Assessment & Plan Note (Signed)
 Hemoglobin A1c back in 2023 was 6.0.  Would recommend continue to monitor.

## 2024-01-23 NOTE — Assessment & Plan Note (Signed)
 New onset of shortness of breath with exertion and occasional fluttering sensation.  No resting chest pain, but does note some discomfort (precordial pain) with a fluttering sensations and dyspnea on exertion.   Shortness of breath at rest, or orthopnea. No dizziness or syncope. -Order 2D echocardiogram to assess for new wall motion abnormalities, or reduced EF -Order a coronary CT angiogram to evaluate for possible coronary artery disease given new symptoms and history of myocardial infarction.

## 2024-01-29 ENCOUNTER — Telehealth (INDEPENDENT_AMBULATORY_CARE_PROVIDER_SITE_OTHER): Payer: Self-pay | Admitting: Gastroenterology

## 2024-01-29 NOTE — Telephone Encounter (Signed)
 Pt left voicemail in regards to pre op. Pt has pre op in Feb but did not show for TCS; pt rescheduled for 3/13. Pt wanted to know if he has to do another pre op. Pt will need to do another pre op since TCS was rescheduled out further. Returned call but had to leave message

## 2024-01-30 HISTORY — PX: OTHER SURGICAL HISTORY: SHX169

## 2024-02-09 ENCOUNTER — Encounter (HOSPITAL_COMMUNITY)
Admission: RE | Admit: 2024-02-09 | Discharge: 2024-02-09 | Disposition: A | Discharge status: Home / Self Care | Payer: Medicare Other | Source: Ambulatory Visit | Attending: Gastroenterology | Admitting: Gastroenterology

## 2024-02-09 ENCOUNTER — Telehealth (INDEPENDENT_AMBULATORY_CARE_PROVIDER_SITE_OTHER): Payer: Self-pay | Admitting: Gastroenterology

## 2024-02-09 NOTE — Telephone Encounter (Signed)
 Pt contacted. Pt will reschedule after cardiology workup

## 2024-02-09 NOTE — Pre-Procedure Instructions (Signed)
  Continue or reschedule Received: Today Omar Amis, RN  Estudillo, Omar [name], CMA; Marlowe Shores, LPN; Franky Macho, MD Good morning! I just got off the phone with Omar [name]. He saw his cardiologist on 2/22 for precordial pain and exertional SOB. He has a 2D echo scheduled for 3/25 and a coronary CTA scheduled for 4/1. Dr Tasia Catchings, do yo want to proceed with his TCS on 3/13 or wait for cardiac testing to come back? Patient also states he just completed a Zios on Sunday 3/9 as well.

## 2024-02-09 NOTE — Pre-Procedure Instructions (Signed)
  RE: Continue or reschedule Received: Today Ahmed, [name], MD  Elsie Amis, RN; Armstead Peaks, CMA; Marlowe Shores, LPN Hi Selena Batten  Thank you for bringing to our attention .  Given exertional SOB and Chest pain , pending cardiology workup I would defiantly reschedule this after cardiology workup has been completed and cardiology clears him for the procedure  Kenney Houseman would you please reschedule this ?       Previous Messages    ----- Message ----- From: Elsie Amis, RN Sent: 02/09/2024   9:44 AM EDT To: Mindy S Estudillo, CMA; Marlowe Shores, LPN; * Subject: Continue or reschedule                        Good morning! I just got off the phone with [name]. He saw his cardiologist on 2/22 for precordial pain and exertional SOB. He has a 2D echo scheduled for 3/25 and a coronary CTA scheduled for 4/1. Dr Tasia Catchings, do yo want to proceed with his TCS on 3/13 or wait for cardiac testing to come back? Patient also states he just completed a Zios on Sunday 3/9 as well.

## 2024-02-09 NOTE — Telephone Encounter (Signed)
 Omar [name], [name], MD  Elsie Amis, RN; Armstead Peaks, CMA; Marlowe Shores, LPN Hi Selena Batten  Thank you for bringing to our attention .  Given exertional SOB and Chest pain , pending cardiology workup I would defiantly reschedule this after cardiology workup has been completed and cardiology clears him for the procedure  Kenney Houseman would you please reschedule this ?       Previous Messages    ----- Message ----- From: Elsie Amis, RN Sent: 02/09/2024   9:44 AM EDT To: Mindy S Estudillo, CMA; Marlowe Shores, LPN; * Subject: Continue or reschedule                        Good morning! I just got off the phone with [name]. He saw his cardiologist on 2/22 for precordial pain and exertional SOB. He has a 2D echo scheduled for 3/25 and a coronary CTA scheduled for 4/1. Dr Tasia Catchings, do yo want to proceed with his TCS on 3/13 or wait for cardiac testing to come back? Patient also states he just completed a Zios on Sunday 3/9 as well.

## 2024-02-10 ENCOUNTER — Ambulatory Visit (HOSPITAL_COMMUNITY): Payer: Medicare Other

## 2024-02-11 ENCOUNTER — Ambulatory Visit (HOSPITAL_COMMUNITY): Admission: RE | Admit: 2024-02-11 | Payer: Medicare Other | Source: Home / Self Care | Admitting: Gastroenterology

## 2024-02-11 ENCOUNTER — Encounter (HOSPITAL_COMMUNITY): Admission: RE | Payer: Self-pay | Source: Home / Self Care

## 2024-02-11 SURGERY — COLONOSCOPY WITH PROPOFOL
Anesthesia: Choice

## 2024-02-17 DIAGNOSIS — I25119 Atherosclerotic heart disease of native coronary artery with unspecified angina pectoris: Secondary | ICD-10-CM | POA: Diagnosis not present

## 2024-02-17 DIAGNOSIS — R002 Palpitations: Secondary | ICD-10-CM | POA: Diagnosis not present

## 2024-02-23 ENCOUNTER — Ambulatory Visit (HOSPITAL_COMMUNITY): Payer: Medicare Other | Attending: Cardiology

## 2024-02-23 DIAGNOSIS — R0609 Other forms of dyspnea: Secondary | ICD-10-CM | POA: Diagnosis not present

## 2024-02-23 DIAGNOSIS — I10 Essential (primary) hypertension: Secondary | ICD-10-CM | POA: Diagnosis not present

## 2024-02-23 DIAGNOSIS — I25119 Atherosclerotic heart disease of native coronary artery with unspecified angina pectoris: Secondary | ICD-10-CM | POA: Diagnosis not present

## 2024-02-23 DIAGNOSIS — R072 Precordial pain: Secondary | ICD-10-CM | POA: Diagnosis not present

## 2024-02-29 ENCOUNTER — Telehealth (HOSPITAL_COMMUNITY): Payer: Self-pay | Admitting: Emergency Medicine

## 2024-02-29 ENCOUNTER — Telehealth (HOSPITAL_COMMUNITY): Payer: Self-pay | Admitting: *Deleted

## 2024-02-29 NOTE — Telephone Encounter (Signed)
 Patient returning call about his upcoming cardiac imaging study; pt verbalizes understanding of appt date/time, parking situation and where to check in, pre-test NPO status, and verified current allergies; name and call back number provided for further questions should they arise  Larey Brick RN Navigator Cardiac Imaging [facility] [phone] office [phone] cell  Patient aware to arrive at 9 AM.

## 2024-02-29 NOTE — Telephone Encounter (Signed)
 Attempted to call patient regarding upcoming cardiac CT appointment. Left message on voicemail with name and callback number Rockwell Alexandria RN Navigator Cardiac Imaging [facility] [phone] Office [phone] Cell

## 2024-03-01 ENCOUNTER — Ambulatory Visit
Admission: RE | Admit: 2024-03-01 | Discharge: 2024-03-01 | Disposition: A | Discharge status: Home / Self Care | Source: Ambulatory Visit | Attending: Cardiology | Admitting: Cardiology

## 2024-03-01 ENCOUNTER — Ambulatory Visit (HOSPITAL_COMMUNITY)
Admission: RE | Admit: 2024-03-01 | Discharge: 2024-03-01 | Disposition: A | Discharge status: Home / Self Care | Source: Ambulatory Visit | Attending: Cardiology | Admitting: Cardiology

## 2024-03-01 ENCOUNTER — Other Ambulatory Visit: Payer: Self-pay | Admitting: Cardiology

## 2024-03-01 DIAGNOSIS — I25119 Atherosclerotic heart disease of native coronary artery with unspecified angina pectoris: Secondary | ICD-10-CM | POA: Insufficient documentation

## 2024-03-01 DIAGNOSIS — R931 Abnormal findings on diagnostic imaging of heart and coronary circulation: Secondary | ICD-10-CM

## 2024-03-01 DIAGNOSIS — R072 Precordial pain: Secondary | ICD-10-CM | POA: Diagnosis not present

## 2024-03-01 DIAGNOSIS — R0609 Other forms of dyspnea: Secondary | ICD-10-CM | POA: Diagnosis not present

## 2024-03-01 DIAGNOSIS — I10 Essential (primary) hypertension: Secondary | ICD-10-CM | POA: Insufficient documentation

## 2024-03-01 HISTORY — PX: CT CTA CORONARY W/CA SCORE W/CM &/OR WO/CM: HXRAD787

## 2024-03-01 HISTORY — PX: TRANSTHORACIC ECHOCARDIOGRAM: SHX275

## 2024-03-01 LAB — ECHOCARDIOGRAM COMPLETE
Area-P 1/2: 4.17 cm2
Est EF: 55
S' Lateral: 2.4 cm

## 2024-03-01 MED ORDER — IOHEXOL 350 MG/ML SOLN
100.0000 mL | Freq: Once | INTRAVENOUS | Status: AC | PRN
  Administered 2024-03-01: 100 mL via INTRAVENOUS

## 2024-03-01 MED ORDER — NITROGLYCERIN 0.4 MG SL SUBL
0.8000 mg | SUBLINGUAL_TABLET | Freq: Once | SUBLINGUAL | Status: AC
  Administered 2024-03-01: 0.8 mg via SUBLINGUAL

## 2024-03-01 MED ORDER — NITROGLYCERIN 0.4 MG SL SUBL
SUBLINGUAL_TABLET | SUBLINGUAL | Status: AC
Start: 2024-03-01 — End: ?
  Filled 2024-03-01: qty 2

## 2024-03-01 NOTE — Progress Notes (Signed)
 Patient tolerated CT well. Vital signs stable encourage to drink water throughout day.Reasons explained and verbalized understanding. Ambulated steady gait.

## 2024-03-08 ENCOUNTER — Ambulatory Visit: Payer: Medicare Other | Admitting: Cardiology

## 2024-03-08 ENCOUNTER — Encounter: Payer: Self-pay | Admitting: Cardiology

## 2024-03-08 VITALS — BP 116/74 | HR 66 | Ht 68.0 in | Wt 179.6 lb

## 2024-03-08 DIAGNOSIS — I10 Essential (primary) hypertension: Secondary | ICD-10-CM

## 2024-03-08 DIAGNOSIS — E785 Hyperlipidemia, unspecified: Secondary | ICD-10-CM | POA: Diagnosis not present

## 2024-03-08 DIAGNOSIS — I252 Old myocardial infarction: Secondary | ICD-10-CM | POA: Diagnosis not present

## 2024-03-08 DIAGNOSIS — I25119 Atherosclerotic heart disease of native coronary artery with unspecified angina pectoris: Secondary | ICD-10-CM

## 2024-03-08 DIAGNOSIS — R002 Palpitations: Secondary | ICD-10-CM

## 2024-03-08 DIAGNOSIS — R072 Precordial pain: Secondary | ICD-10-CM

## 2024-03-08 DIAGNOSIS — I2089 Other forms of angina pectoris: Secondary | ICD-10-CM

## 2024-03-08 NOTE — Progress Notes (Unsigned)
  Cardiology Office Note:  .   Date:  03/08/2024  ID:  Omar [name], DOB 1956-01-01, MRN [id number] PCP: Patient, No Pcp Per  [facility] Providers Cardiologist:  Bryan Lemma, MD { Click to update primary MD,subspecialty MD or APP then REFRESH:1}    No chief complaint on file.   Patient Profile: .     Omar [name] is a 68 y.o. male  with a PMH below who presents here for 2 month f/u at the request of Mechele Claude, MD.  He is a former patient of Dr. Susa Griffins and was enrolled in the PAMI trial in June of 1997.  May 30, 1996 - Inf STEMI => RCA occluded  - PTCA/POBA (NOT STENT) Re-look Cath Jan 1998-minimal residual stenosis at the PTCA site. Multiple negative stress test.  Most recently in 2009. Has had mild class I stable angina for years    Omar [name] was last seen on ***   Subjective  Discussed the use of AI scribe software for clinical note transcription with the patient, who gave verbal consent to proceed.  History of Present Illness   Cardiovascular ROS: {roscv:310661}  ROS:  Review of Systems - {ros master:310782}    Objective    Studies Reviewed: Marland Kitchen        ECHO: ***  MONITOR: ***  CT: ***  Risk Assessment/Calculations:            Physical Exam:   VS:  BP 116/74 (BP Location: Left Arm, Patient Position: Sitting, Cuff Size: Small)   Pulse 66   Ht 5\' 8"  (1.727 m)   Wt 179 lb 9.6 oz (81.5 kg)   SpO2 97%   BMI 27.31 kg/m    Wt Readings from Last 3 Encounters:  03/08/24 179 lb 9.6 oz (81.5 kg)  01/22/24 181 lb 9.6 oz (82.4 kg)  09/29/23 178 lb 3.2 oz (80.8 kg)    GEN: Well nourished, well developed in no acute distress; healthy appearing NECK: No JVD; No carotid bruits CARDIAC: Normal S1, S2; RRR, no murmurs, rubs, gallops RESPIRATORY:  Clear to auscultation without rales, wheezing or rhonchi ; nonlabored, good air movement. ABDOMEN: Soft, non-tender, non-distended EXTREMITIES:  No edema; No deformity       ASSESSMENT AND PLAN: .    Problem List Items Addressed This Visit       Cardiology Problems   Coronary artery disease involving native coronary artery of native heart with angina pectoris (HCC) - Primary (Chronic)   Essential hypertension (Chronic)   Hyperlipidemia with target low density lipoprotein (LDL) cholesterol less than 55 mg/dL (Chronic)   Stable angina (HCC) (Chronic)     Other   History of acute inferior wall MI (Chronic)   Palpitations    Assessment and Plan Assessment & Plan        {Are you ordering a CV Procedure (e.g. stress test, cath, DCCV, TEE, etc)?   Press F2        :[id number]}   Follow-Up: No follow-ups on file.  Total time spent: *** min spent with patient + *** min spent charting = *** min    Signed, Marykay Lex, MD, MS Bryan Lemma, M.D., M.S. Interventional Cardiologist  [facility]  Pager # [phone] Phone # [phone] [address] [place], [place] [address]

## 2024-03-08 NOTE — Patient Instructions (Addendum)
 Medication Instructions:  No changes   *If you need a refill on your cardiac medications before your next appointment, please call your pharmacy*   Lab Work: Not needed   Testing/Procedures: Not needed  Follow-Up: At [facility], you and your health needs are our priority.  As part of our continuing mission to provide you with exceptional heart care, we have created designated Provider Care Teams.  These Care Teams include your primary Cardiologist (physician) and Advanced Practice Providers (APPs -  Physician Assistants and Nurse Practitioners) who all work together to provide you with the care you need, when you need it.     Your next appointment:   12 month(s)  The format for your next appointment:   In Person  Provider:   Bryan Lemma, MD    Other Instructions

## 2024-03-10 ENCOUNTER — Encounter: Payer: Self-pay | Admitting: Cardiology

## 2024-03-10 DIAGNOSIS — I25119 Atherosclerotic heart disease of native coronary artery with unspecified angina pectoris: Secondary | ICD-10-CM | POA: Diagnosis not present

## 2024-03-10 DIAGNOSIS — R002 Palpitations: Secondary | ICD-10-CM

## 2024-03-10 NOTE — Assessment & Plan Note (Signed)
 Rare PACs is and PVCs, benign study with no sustained arrhythmias.  Probably hold off on beta-blockers since they are not overly worrisome his resting heart rate is 66 bpm.

## 2024-03-10 NOTE — Assessment & Plan Note (Signed)
 Cholesterol levels well-managed with current medications. - Continue Crestor 40 mg and Zetia 10 mg.

## 2024-03-10 NOTE — Assessment & Plan Note (Addendum)
 Interestingly, the RCA appears to be widely patent but it is the LCx that is occluded with no history to support when that would have occurred.  Currently asymptomatic and will just manage medically.Marland Kitchen

## 2024-03-10 NOTE — Assessment & Plan Note (Addendum)
 Minimal plaque in RCA, mild diffuse plaque in LAD with 50-60% stenosis, small possibly occluded circumflex artery.  Notably less symptomatic than last visit.  As such, no intervention needed. With occluded LCx but Coronary CTA and no active ongoing symptoms, no urgent requirement for reevaluation, but low threshold to consider an invasive evaluation if symptoms worsen.  - Continue Crestor 40 mg, Zetia 10 mg, amlodipine 5 mg, hydrochlorothiazide 12.5 mg, aspirin 81 mg daily. -Refill NTG

## 2024-03-10 NOTE — Assessment & Plan Note (Signed)
 Based on Coronary CTA results, I am not convinced that his chest discomfort episodes were truly anginal in nature.  Continue amlodipine for antianginal benefit.

## 2024-03-10 NOTE — Assessment & Plan Note (Signed)
 Blood pressure well-controlled on current medications. - Continue amlodipine 5 mg and hydrochlorothiazide 12.5 mg.

## 2024-04-19 ENCOUNTER — Telehealth (INDEPENDENT_AMBULATORY_CARE_PROVIDER_SITE_OTHER): Payer: Self-pay | Admitting: Gastroenterology

## 2024-04-19 DIAGNOSIS — Z860100 Personal history of colon polyps, unspecified: Secondary | ICD-10-CM

## 2024-04-19 NOTE — Telephone Encounter (Signed)
    04/19/24  Katrina Parma [birth year]  What type of surgery is being performed? Colonoscopy   When is surgery scheduled? TBD  What type of clearance is required (medical or pharmacy to hold medication or both? Medical clearace  Are there any medications that need to be held prior to surgery and how long? none  Name of physician performing surgery?  Dr. Lorie R[facility] Phone: [phone] Fax: [phone]  Anethesia type (none, local, MAC, general)? Choice

## 2024-04-19 NOTE — Telephone Encounter (Signed)
 Pt left voicemail to reschedule TCS that was scheduled for March. Pt has seen cardiology. Sent cardiac clearance. Returned call to pt and made him aware I will need to get cardiac clearance first and will then get him scheduled. Pt verbalized understanding.

## 2024-04-20 NOTE — Telephone Encounter (Signed)
   Patient Name: Omar [name]  DOB: 18-Feb-1956 MRN: [id number]  Primary Cardiologist: Randene Bustard, MD  Chart reviewed as part of pre-operative protocol coverage. Given past medical history and time since last visit, based on ACC/AHA guidelines, OJANI BERENSON is at acceptable risk for the planned procedure without further cardiovascular testing.   The patient was advised that if he develops new symptoms prior to surgery to contact our office to arrange for a follow-up visit, and he verbalized understanding.  I will route this recommendation to the requesting party via Epic fax function and remove from pre-op pool.  Please call with questions.  Friddie Jetty, NP 04/20/2024, 9:09 AM

## 2024-04-22 NOTE — Addendum Note (Signed)
 Addended by: Cammeron Greis on: 04/22/2024 08:18 AM   Modules accepted: Orders

## 2024-04-22 NOTE — Telephone Encounter (Signed)
 Cardiac clearance received. Pt contacted but spoke with wife. Pt scheduled for 04/28/24. Pt already has prep. Instructions will be mailed. No pa needed per [facility]

## 2024-04-26 ENCOUNTER — Other Ambulatory Visit (HOSPITAL_COMMUNITY)
Admission: RE | Admit: 2024-04-26 | Discharge: 2024-04-26 | Disposition: A | Discharge status: Home / Self Care | Source: Ambulatory Visit | Attending: Gastroenterology | Admitting: Gastroenterology

## 2024-04-26 ENCOUNTER — Telehealth (INDEPENDENT_AMBULATORY_CARE_PROVIDER_SITE_OTHER): Payer: Self-pay | Admitting: Gastroenterology

## 2024-04-26 DIAGNOSIS — Z860100 Personal history of colon polyps, unspecified: Secondary | ICD-10-CM | POA: Diagnosis not present

## 2024-04-26 DIAGNOSIS — Z09 Encounter for follow-up examination after completed treatment for conditions other than malignant neoplasm: Secondary | ICD-10-CM | POA: Diagnosis not present

## 2024-04-26 LAB — BASIC METABOLIC PANEL WITH GFR
Anion gap: 6 (ref 5–15)
BUN: 17 mg/dL (ref 8–23)
CO2: 28 mmol/L (ref 22–32)
Calcium: 9.6 mg/dL (ref 8.9–10.3)
Chloride: 104 mmol/L (ref 98–111)
Creatinine, Ser: 0.87 mg/dL (ref 0.61–1.24)
GFR, Estimated: >60 mL/min (ref >60)
Glucose, Bld: 102 mg/dL — ABNORMAL HIGH (ref 70–99)
Potassium: 4.2 mmol/L (ref 3.5–5.1)
Sodium: 138 mmol/L (ref 135–145)

## 2024-04-26 NOTE — Telephone Encounter (Signed)
 Pt called in to get clarification on TCS instructions. Pt states he has not received his instructions through mail at this time. Went over instructions with pt. Pt read back instructions to me. Advised pt of pre op telephone call tomorrow at 8:30am.

## 2024-04-27 ENCOUNTER — Encounter (HOSPITAL_COMMUNITY)
Admission: RE | Admit: 2024-04-27 | Discharge: 2024-04-27 | Disposition: A | Discharge status: Home / Self Care | Source: Ambulatory Visit | Attending: Gastroenterology | Admitting: Gastroenterology

## 2024-04-27 ENCOUNTER — Ambulatory Visit: Payer: Self-pay

## 2024-04-27 NOTE — Pre-Procedure Instructions (Signed)
 Attempted pre-op phone call. Left 2nd VM.

## 2024-04-27 NOTE — Pre-Procedure Instructions (Signed)
 Attempted pre-op phone call. Left VM for him to call us back.

## 2024-04-28 ENCOUNTER — Ambulatory Visit (HOSPITAL_COMMUNITY): Admitting: Certified Registered"

## 2024-04-28 ENCOUNTER — Encounter (HOSPITAL_COMMUNITY)
Admission: RE | Disposition: A | Discharge status: Home / Self Care | Payer: Self-pay | Source: Home / Self Care | Attending: Gastroenterology

## 2024-04-28 ENCOUNTER — Ambulatory Visit (HOSPITAL_COMMUNITY)
Admission: RE | Admit: 2024-04-28 | Discharge: 2024-04-28 | Disposition: A | Discharge status: Home / Self Care | Attending: Gastroenterology | Admitting: Gastroenterology

## 2024-04-28 DIAGNOSIS — I25118 Atherosclerotic heart disease of native coronary artery with other forms of angina pectoris: Secondary | ICD-10-CM | POA: Diagnosis not present

## 2024-04-28 DIAGNOSIS — Z87891 Personal history of nicotine dependence: Secondary | ICD-10-CM | POA: Insufficient documentation

## 2024-04-28 DIAGNOSIS — K644 Residual hemorrhoidal skin tags: Secondary | ICD-10-CM | POA: Insufficient documentation

## 2024-04-28 DIAGNOSIS — I25119 Atherosclerotic heart disease of native coronary artery with unspecified angina pectoris: Secondary | ICD-10-CM | POA: Insufficient documentation

## 2024-04-28 DIAGNOSIS — I252 Old myocardial infarction: Secondary | ICD-10-CM | POA: Diagnosis not present

## 2024-04-28 DIAGNOSIS — K635 Polyp of colon: Secondary | ICD-10-CM | POA: Diagnosis not present

## 2024-04-28 DIAGNOSIS — Z860100 Personal history of colon polyps, unspecified: Secondary | ICD-10-CM

## 2024-04-28 DIAGNOSIS — I10 Essential (primary) hypertension: Secondary | ICD-10-CM

## 2024-04-28 DIAGNOSIS — K5730 Diverticulosis of large intestine without perforation or abscess without bleeding: Secondary | ICD-10-CM | POA: Insufficient documentation

## 2024-04-28 DIAGNOSIS — G4733 Obstructive sleep apnea (adult) (pediatric): Secondary | ICD-10-CM | POA: Insufficient documentation

## 2024-04-28 DIAGNOSIS — D121 Benign neoplasm of appendix: Secondary | ICD-10-CM | POA: Diagnosis not present

## 2024-04-28 DIAGNOSIS — K648 Other hemorrhoids: Secondary | ICD-10-CM | POA: Diagnosis not present

## 2024-04-28 DIAGNOSIS — Z1211 Encounter for screening for malignant neoplasm of colon: Secondary | ICD-10-CM | POA: Diagnosis not present

## 2024-04-28 HISTORY — PX: COLONOSCOPY: SHX5424

## 2024-04-28 LAB — HM COLONOSCOPY

## 2024-04-28 SURGERY — COLONOSCOPY
Anesthesia: General

## 2024-04-28 MED ORDER — PROPOFOL 500 MG/50ML IV EMUL: INTRAVENOUS | Status: DC | PRN

## 2024-04-28 MED ORDER — PROPOFOL 500 MG/50ML IV EMUL
INTRAVENOUS | Status: DC | PRN
  Administered 2024-04-28: 150 ug/kg/min via INTRAVENOUS
  Administered 2024-04-28: 60 mg via INTRAVENOUS
  Administered 2024-04-28: 20 mg via INTRAVENOUS

## 2024-04-28 MED ORDER — LACTATED RINGERS IV SOLN: INTRAVENOUS | Status: DC | PRN

## 2024-04-28 NOTE — Discharge Instructions (Signed)

## 2024-04-28 NOTE — Op Note (Signed)
 [facility] Patient Name: Omar [name] Procedure Date: 04/28/2024 7:06 AM MRN: [id number] Date of Birth: 05/13/1956 Attending MD: Terril Fetters , MD, [id number] CSN: [id number] Age: 68 Admit Type: Outpatient Procedure:                Colonoscopy Indications:              Screening for colorectal malignant neoplasm Providers:                Terril Fetters, MD, Crystal Page, Annell Barrow Referring MD:              Medicines:                Monitored Anesthesia Care Complications:            No immediate complications. Estimated Blood Loss:     Estimated blood loss: none. Procedure:                Pre-Anesthesia Assessment:                           - Prior to the procedure, a History and Physical                            was performed, and patient medications and                            allergies were reviewed. The patient's tolerance of                            previous anesthesia was also reviewed. The risks                            and benefits of the procedure and the sedation                            options and risks were discussed with the patient.                            All questions were answered, and informed consent                            was obtained. Prior Anticoagulants: The patient has                            taken no anticoagulant or antiplatelet agents                            except for aspirin. ASA Grade Assessment: II - A                            patient with mild systemic disease. After reviewing                            the risks and benefits, the patient was deemed in  satisfactory condition to undergo the procedure.                           After obtaining informed consent, the colonoscope                            was passed under direct vision. Throughout the                            procedure, the patient's blood pressure, pulse, and                            oxygen saturations were  monitored continuously. The                            [id number]) scope was introduced through the                            anus and advanced to the the terminal ileum. The                            colonoscopy was performed without difficulty. The                            patient tolerated the procedure well. The quality                            of the bowel preparation was evaluated using the                            BBPS [facility] Bowel Preparation Scale) with scores                            of: Right Colon = 2 (minor amount of residual                            staining, small fragments of stool and/or opaque                            liquid, but mucosa seen well), Transverse Colon = 3                            (entire mucosa seen well with no residual staining,                            small fragments of stool or opaque liquid) and Left                            Colon = 3 (entire mucosa seen well with no residual                            staining, small fragments of stool or opaque  liquid). The total BBPS score equals 8. The                            terminal ileum, ileocecal valve, appendiceal                            orifice, and rectum were photographed. Scope In: 7:35:45 AM Scope Out: 7:57:55 AM Scope Withdrawal Time: 0 hours 18 minutes 23 seconds  Total Procedure Duration: 0 hours 22 minutes 10 seconds  Findings:      The perianal and digital rectal examinations were normal.      A 3 mm polyp was found in the appendiceal orifice. The polyp was       sessile. The polyp was removed with a cold snare. Resection and       retrieval were complete.      Scattered large-mouthed diverticula were found in the left colon.      Non-bleeding external and internal hemorrhoids were found during       retroflexion. The hemorrhoids were medium-sized.      The terminal ileum appeared normal. Impression:               - One 3 mm polyp at  the appendiceal orifice,                            removed with a cold snare. Resected and retrieved.                           - Diverticulosis in the left colon.                           - Non-bleeding external and internal hemorrhoids.                           - The examined portion of the ileum was normal. Moderate Sedation:      Per Anesthesia Care Recommendation:           - Patient has a contact number available for                            emergencies. The signs and symptoms of potential                            delayed complications were discussed with the                            patient. Return to normal activities tomorrow.                            Written discharge instructions were provided to the                            patient.                           - High fiber diet.                           -  Continue present medications.                           - Await pathology results.                           - Repeat colonoscopy in 7-10 years for surveillance                            based on pathology results, if medically fit                           - Return to primary care physician as previously                            scheduled. Procedure Code(s):        --- Professional ---                           [phone], Colonoscopy, flexible; with removal of                            tumor(s), polyp(s), or other lesion(s) by snare                            technique Diagnosis Code(s):        --- Professional ---                           Z12.11, Encounter for screening for malignant                            neoplasm of colon                           D12.1, Benign neoplasm of appendix                           K64.8, Other hemorrhoids                           K57.30, Diverticulosis of large intestine without                            perforation or abscess without bleeding CPT copyright 2022 American Medical Association. All rights reserved. The codes  documented in this report are preliminary and upon coder review may  be revised to meet current compliance requirements. Terril Fetters, MD Terril Fetters, MD 04/28/2024 8:02:19 AM This report has been signed electronically. Number of Addenda: 0

## 2024-04-28 NOTE — H&P (Signed)
 Primary Care Physician:  Roise Cleaver, MD Primary Gastroenterologist:  Dr. Alita Irwin  Pre-Procedure History & Physical: HPI:  Omar [name] is a 68 y.o. male is here for a colonoscopy for colon cancer screening purposes.  Patient denies any family history of colorectal cancer.  No melena or hematochezia.  No abdominal pain or unintentional weight loss.  No change in bowel habits.  Overall feels well from a GI standpoint.  Past Medical History:  Diagnosis Date   Anxiety 07/19/2003   Dr.Saaat [facility]   Aortic valve sclerosis 12/2012   Noted on echocardiogram to have aortic sclerosis without stenosis. Ordered for murmur   CAD S/P percutaneous coronary angioplasty 05/30/1996   single -vessel disease (RCA) Rx with POBA on PAMI protocol in june 1997; Re-Look Cath 12/1996: ~20-30% residual no stenosis in 1998 with good LV Function; Myoview  6/'09:  low risk scan ,post EF 58%, this was the fourth such scan in as many years   Chronic low back pain    Has seen Dr. Rozelle Corning   Dyslipidemia, goal LDL below 70    On statin   Hypertension    Lumbosacral disc disease 01/01/2010   ThIs information from CT done several years ago   Obstructive sleep apnea 07/19/2003   breathing disorder services,Dr. Saadat Khann. 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   ST elevation myocardial infarction (STEMI) involving right coronary artery in recovery phase (HCC) 05/30/1996   100% occluded RCA -- PTCA-POBA (~20% residual) of RCA (part of PAMI Trial) ;; Normal EF By Echo in 2014    Past Surgical History:  Procedure Laterality Date   COLONOSCOPY N/A 10/27/2018   Procedure: COLONOSCOPY;  Surgeon: Ruby Corporal, MD;  [facility];  Service: Endoscopy;  Laterality: N/A;  1030   CT CTA CORONARY W/CA SCORE W/CM &/OR WO/CM  03/01/2024   CAC 1194 with TPG 772. Dominant RCA minimal plaque t/o -< Terminates as PDA with PLB (FFRCT neg).   LMCA: Large caliber w/ mild mixed plaque -< trifurcates into LAD, LCx and RI.LAD: minimal Prox, mild mid & minimal distal LAD (FFR Ct Neg).  Minimal plaque branching D1 w/ Mod ostial plaque (FFR Ct Neg); nondominant relatively small LCx - prox CTO.  RI with mild prox  Dz.   HERNIA REPAIR  2005   remote laparscopic hernia repair by Dr.martin in may 2005   LEFT HEART CATH AND CORONARY ANGIOGRAPHY  12/23/1996   ASHD h/o acute DMI with emergency PTCA- randomized to balloon PTCA 05-30-1996 reperfusion time 2 hrs 2 min.,single vessel coronary disease RCA   NM MYOVIEW  LTD  05/2008   Mild anterior defect consistent with chest wall attenuation. Normal EF and no ischemia, no infarction   POLYPECTOMY  10/27/2018   Procedure: POLYPECTOMY;  Surgeon: Ruby Corporal, MD;  Location: [facility];  Service: Endoscopy;;  colon   TRANSTHORACIC ECHOCARDIOGRAM  12/2012   Mild concentric LVH. Normal systolic and diastolic function with EF 55-65%. Mild LA dilation. Aortic sclerosis without stenosis.   TRANSTHORACIC ECHOCARDIOGRAM  03/01/2024   Normal LV size and function.  EF 55%.  No RWMA.  Mild interventricular septal thickening.  Mild AV calcification/sclerosis with no stenosis.  Normal MV   Zio patch monitor  01/2024   Normal rhythm, heart rate range 41-139 bpm, average 71 bpm, rare premature atrial contractions (PACs), rare premature ventricular contractions (PVCs), four short atrial runs (5-14 beats, max 150  bpm)    Prior to Admission medications   Medication Sig Start Date End Date Taking? Authorizing Provider  amLODipine  (NORVASC ) 5 MG tablet TAKE 1 TABLET (5 MG TOTAL) BY MOUTH DAILY. 10/01/23  Yes Arleen Lacer, MD  aspirin 81 MG tablet Take 81 mg by mouth daily.   Yes [provider]  ezetimibe  (ZETIA ) 10 MG tablet TAKE 1 TABLET BY MOUTH EVERY DAY 10/01/23  Yes Arleen Lacer, MD  hydrochlorothiazide  (MICROZIDE ) 12.5 MG capsule Take 1 capsule (12.5 mg total) by mouth daily. 12/21/23  Yes  Arleen Lacer, MD  rosuvastatin  (CRESTOR ) 40 MG tablet TAKE 1 TABLET BY MOUTH EVERY DAY 10/01/23  Yes Arleen Lacer, MD  vitamin E 400 UNIT capsule Take 400 Units by mouth daily.   Yes [provider]  nitroGLYCERIN  (NITROSTAT ) 0.4 MG SL tablet Place 1 tablet (0.4 mg total) under the tongue every 5 (five) minutes as needed for chest pain. 07/02/20   Arleen Lacer, MD    Allergies as of 04/22/2024   (No Known Allergies)    Family History  Problem Relation Age of Onset   Healthy Mother    Other Father        Unknown details   Colon cancer Neg Hx     Social History   Socioeconomic History   Marital status: Married    Spouse name: Ninette Basque   Number of children: 2   Years of education: Not on file   Highest education level: Not on file  Occupational History   Not on file  Tobacco Use   Smoking status: Former    Current packs/day: 0.00    Types: Cigarettes    Quit date: 12/02/1995    Years since quitting: 28.4   Smokeless tobacco: Former  Substance and Sexual Activity   Alcohol use: Yes    Comment: occasionally   Drug use: Not Currently    Types: Marijuana    Comment: in the 1960's   Sexual activity: Yes    Partners: Female    Comment: married  Other Topics Concern   Not on file  Social History Narrative   Married father of 2 with 3 children. He quit smoking in 1997.   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 He does drink not alcohol.   Social Drivers of Corporate investment banker Strain: Low Risk  (07/10/2021)   Overall Financial Resource Strain (CARDIA)    Difficulty of Paying Living Expenses: Not hard at all  Food Insecurity: No Food Insecurity (07/10/2021)   Hunger Vital Sign    Worried About Running Out of Food in the Last Year: Never true    Ran Out of Food in the Last Year: Never true  Transportation Needs: No Transportation Needs (07/10/2021)   PRAPARE - Administrator, Civil Service (Medical): No    Lack of Transportation  (Non-Medical): No  Physical Activity: Inactive (07/10/2021)   Exercise Vital Sign    Days of Exercise per Week: 0 days    Minutes of Exercise per Session: 0 min  Stress: No Stress Concern Present (07/10/2021)   [employer] of Occupational Health - Occupational Stress Questionnaire    Feeling of Stress : Not at all  Social Connections: Unknown (04/14/2022)   Received from [facility]   Social Network    Social Network: Not on file  Intimate Partner Violence: Unknown (03/06/2022)   Received from [facility]   HITS  Physically Hurt: Not on file    Insult or Talk Down To: Not on file    Threaten Physical Harm: Not on file    Scream or Curse: Not on file    Review of Systems: See HPI, otherwise negative ROS  Physical Exam: Vital signs in last 24 hours: Temp:  [98.4 F (36.9 C)] 98.4 F (36.9 C) (05/29 0647) Pulse Rate:  [54-55] 55 (05/29 0700) Resp:  [14-18] 14 (05/29 0700) BP: (134)/(68) 134/68 (05/29 0647) SpO2:  [100 %] 100 % (05/29 0700)   General:   Alert,  Well-developed, well-nourished, pleasant and cooperative in NAD Head:  Normocephalic and atraumatic. Eyes:  Sclera clear, no icterus.   Conjunctiva pink. Ears:  Normal auditory acuity. Nose:  No deformity, discharge,  or lesions. Msk:  Symmetrical without gross deformities. Normal posture. Extremities:  Without clubbing or edema. Neurologic:  Alert and  oriented x4;  grossly normal neurologically. Skin:  Intact without significant lesions or rashes. Psych:  Alert and cooperative. Normal mood and affect.  Impression/Plan: ALDYN TOON is here for a colonoscopy to be performed for colon cancer screening purposes.  The risks of the procedure including infection, bleed, or perforation as well as benefits, limitations, alternatives and imponderables have been reviewed with the patient. Questions have been answered. All parties agreeable.

## 2024-04-28 NOTE — Transfer of Care (Signed)
 Immediate Anesthesia Transfer of Care Note  Patient: Omar [name]  Procedure(s) Performed: COLONOSCOPY  Patient Location: PACU and Short Stay  Anesthesia Type:General  Level of Consciousness: sedated  Airway & Oxygen Therapy: Patient Spontanous Breathing  Post-op Assessment: Report given to RN and Post -op Vital signs reviewed and stable  Post vital signs: Reviewed and stable  Last Vitals:  Vitals Value Taken Time  BP 94/61 04/28/24 0801  Temp 36.4 C 04/28/24 0801  Pulse 59 04/28/24 0801  Resp 14 04/28/24 0801  SpO2 100 % 04/28/24 0801    Last Pain:  Vitals:   04/28/24 0801  TempSrc: Oral  PainSc: 0-No pain         Complications: No notable events documented.

## 2024-04-28 NOTE — Anesthesia Preprocedure Evaluation (Signed)
 Anesthesia Evaluation  Patient identified by MRN, date of birth, ID band Patient awake    Reviewed: Allergy & Precautions, H&P , NPO status , Patient's Chart, lab work & pertinent test results, reviewed documented beta blocker date and time   Airway Mallampati: II  TM Distance: >3 FB Neck ROM: full    Dental no notable dental hx.    Pulmonary neg pulmonary ROS, sleep apnea , former smoker   Pulmonary exam normal breath sounds clear to auscultation       Cardiovascular Exercise Tolerance: Good hypertension, + angina  + CAD and + Past MI  negative cardio ROS  Rhythm:regular Rate:Normal     Neuro/Psych   Anxiety      Neuromuscular disease negative neurological ROS  negative psych ROS   GI/Hepatic negative GI ROS, Neg liver ROS,,,  Endo/Other  negative endocrine ROS    Renal/GU negative Renal ROS  negative genitourinary   Musculoskeletal   Abdominal   Peds  Hematology negative hematology ROS (+)   Anesthesia Other Findings   Reproductive/Obstetrics negative OB ROS                             Anesthesia Physical Anesthesia Plan  ASA: 3  Anesthesia Plan: General   Post-op Pain Management:    Induction:   PONV Risk Score and Plan: Propofol  infusion  Airway Management Planned:   Additional Equipment:   Intra-op Plan:   Post-operative Plan:   Informed Consent: I have reviewed the patients History and Physical, chart, labs and discussed the procedure including the risks, benefits and alternatives for the proposed anesthesia with the patient or authorized representative who has indicated his/her understanding and acceptance.     Dental Advisory Given  Plan Discussed with: CRNA  Anesthesia Plan Comments:        Anesthesia Quick Evaluation

## 2024-04-29 ENCOUNTER — Encounter (INDEPENDENT_AMBULATORY_CARE_PROVIDER_SITE_OTHER): Payer: Self-pay | Admitting: *Deleted

## 2024-04-29 ENCOUNTER — Encounter (HOSPITAL_COMMUNITY): Payer: Self-pay | Admitting: Gastroenterology

## 2024-04-29 NOTE — Telephone Encounter (Signed)
-----   Message from [facility] sent at 04/27/2024  4:41 PM EDT ----- Labs stable. No change needed.  Thanks MJP

## 2024-04-29 NOTE — Telephone Encounter (Signed)
 Left message on home phone and cell phone to callback in regards to labs on 04/26/24   If patient return the call - anyone can give him the results

## 2024-05-01 NOTE — Anesthesia Postprocedure Evaluation (Signed)
 Anesthesia Post Note  Patient: Omar [name]  Procedure(s) Performed: COLONOSCOPY  Patient location during evaluation: Phase II Anesthesia Type: General Level of consciousness: awake Pain management: pain level controlled Vital Signs Assessment: post-procedure vital signs reviewed and stable Respiratory status: spontaneous breathing and respiratory function stable Cardiovascular status: blood pressure returned to baseline and stable Postop Assessment: no headache and no apparent nausea or vomiting Anesthetic complications: no Comments: Late entry   No notable events documented.   Last Vitals:  Vitals:   04/28/24 0801 04/28/24 0805  BP: 94/61 99/66  Pulse: (!) 59 (!) 53  Resp: 14 13  Temp: (!) 36.4 C   SpO2: 100% 100%    Last Pain:  Vitals:   04/28/24 0805  TempSrc:   PainSc: 0-No pain                 Coretha Dew

## 2024-05-02 LAB — SURGICAL PATHOLOGY

## 2024-05-05 ENCOUNTER — Ambulatory Visit (INDEPENDENT_AMBULATORY_CARE_PROVIDER_SITE_OTHER): Payer: Self-pay | Admitting: Gastroenterology

## 2024-07-25 ENCOUNTER — Ambulatory Visit: Payer: Self-pay

## 2024-07-25 NOTE — Telephone Encounter (Signed)
 FYI Only or Action Required?: FYI only for provider.  Patient was last seen in primary care on 07/24/2022 by Zollie Lowers, MD.  Called Nurse Triage reporting Constipation.  Symptoms began several days ago.  Interventions attempted: OTC medications: laxatives and Dietary changes.  Symptoms are: gradually worsening.  Triage Disposition: See PCP When Office is Open (Within 3 Days)  Patient/caregiver understands and will follow disposition?: Yes  Copied from CRM [id number]. Topic: Clinical - Medical Advice >> Jul 25, 2024  2:31 PM Turkey B wrote: Patient called in , has constipation,says seems to be getting worse,  hasn't gone to the bathroom in 7 days, stomach ache, but no severe pain today Reason for Disposition  Unable to have a bowel movement (BM) without laxative or enema  Answer Assessment - Initial Assessment Questions 1. STOOL PATTERN OR FREQUENCY: How often do you have a bowel movement (BM)?  (Normal range: 3 times a day to every 3 days)  When was your last BM?       Last BM was yesterday  2. STRAINING: Do you have to strain to have a BM?      Yes  3. ONSET: When did the constipation begin?     Has been off/on for several years worsening over the past year  4. RECTAL PAIN: Does your rectum hurt when the stool comes out? If Yes, ask: Do you have hemorrhoids? How bad is the pain?  (Scale 1-10; or mild, moderate, severe)     *No Answer* 5. BM COMPOSITION: Are the stools hard?      Yes, comes on in little balls  6. BLOOD ON STOOLS: Has there been any blood on the toilet tissue or on the surface of the BM? If Yes, ask: When was the last time?     No  7. CHRONIC CONSTIPATION: Is this a new problem for you?  If No, ask: How long have you had this problem? (days, weeks, months)      No, has chronic constipations  8. CHANGES IN DIET OR HYDRATION: Have there been any recent changes in your diet? How much fluids are you drinking on a daily basis?  How  much have you had to drink today?     No, has been trying to eat more fiber  9. MEDICINES: Have you been taking any new medicines? Are you taking any narcotic pain medicines? (e.g., Dilaudid, morphine, Percocet, Vicodin)     No  10. LAXATIVES: Have you been using any stool softeners, laxatives, or enemas?  If Yes, ask What are you using, how often, and when was the last time?       Yes, miralax, magnesium citrate,   11. ACTIVITY:  How much walking do you do every day?  Has your activity level decreased in the past week?        Stays active  12. CAUSE: What do you think is causing the constipation?        Unsure  13. MEDICAL HISTORY: Do you have a history of hemorrhoids, rectal fissures, rectal surgery, or rectal abscess?         Hemorrhoids  14. OTHER SYMPTOMS: Do you have any other symptoms? (e.g., abdomen pain, bloating, fever, vomiting)       Abdominal pain and had some bloating yesterday  Protocols used: Constipation-A-AH

## 2024-07-25 NOTE — Telephone Encounter (Signed)
 Appointment scheduled.

## 2024-07-26 ENCOUNTER — Encounter: Payer: Self-pay | Admitting: Family Medicine

## 2024-07-26 ENCOUNTER — Ambulatory Visit (INDEPENDENT_AMBULATORY_CARE_PROVIDER_SITE_OTHER): Admitting: Family Medicine

## 2024-07-26 ENCOUNTER — Ambulatory Visit (INDEPENDENT_AMBULATORY_CARE_PROVIDER_SITE_OTHER)

## 2024-07-26 ENCOUNTER — Telehealth: Payer: Self-pay | Admitting: Family Medicine

## 2024-07-26 VITALS — BP 114/65 | HR 60 | Temp 97.1°F | Ht 68.0 in | Wt 179.2 lb

## 2024-07-26 DIAGNOSIS — K5900 Constipation, unspecified: Secondary | ICD-10-CM

## 2024-07-26 DIAGNOSIS — R1012 Left upper quadrant pain: Secondary | ICD-10-CM

## 2024-07-26 DIAGNOSIS — R109 Unspecified abdominal pain: Secondary | ICD-10-CM | POA: Diagnosis not present

## 2024-07-26 DIAGNOSIS — K5792 Diverticulitis of intestine, part unspecified, without perforation or abscess without bleeding: Secondary | ICD-10-CM

## 2024-07-26 MED ORDER — TRULANCE 3 MG PO TABS
3.0000 mg | ORAL_TABLET | Freq: Every day | ORAL | 2 refills | Status: AC
Start: 2024-07-26 — End: ?

## 2024-07-26 MED ORDER — LINACLOTIDE 290 MCG PO CAPS: 290.0000 ug | ORAL_CAPSULE | Freq: Every day | ORAL | 3 refills | Status: DC

## 2024-07-26 MED ORDER — METRONIDAZOLE 500 MG PO TABS: 500.0000 mg | ORAL_TABLET | Freq: Two times a day (BID) | ORAL | 0 refills | Status: DC

## 2024-07-26 MED ORDER — CIPROFLOXACIN HCL 500 MG PO TABS: 500.0000 mg | ORAL_TABLET | Freq: Two times a day (BID) | ORAL | 0 refills | Status: DC

## 2024-07-26 NOTE — Progress Notes (Addendum)
 Subjective:  Patient ID: Omar [name], male    DOB: [birth year]  Age: 68 y.o. MRN: [id number]  CC: Abdominal Pain   HPI  Discussed the use of AI scribe software for clinical note transcription with the patient, who gave verbal consent to proceed.  History of Present Illness Omar [name] is a 68 year old male with chronic constipation who presents with worsening constipation and abdominal pain.  He has experienced chronic constipation for the past three to four years, characterized by episodes of no bowel movements for seven to eight days, followed by a day with multiple bowel movements, up to seven times. These episodes are accompanied by significant bloating and discomfort. Linzess  was previously effective but was discontinued due to cost. He uses magnesium citrate intermittently but is concerned about dependency.  He has a history of diverticulosis, with symptoms including abdominal pain and changes in bowel habits. The pain is localized to the left side of the abdomen, particularly in the left upper quadrant, and is associated with cramping. The pain can be severe, leading to soreness and discomfort. Dietary adjustments have been made to avoid exacerbating foods like nuts and seeds.  His past medical history includes a heart attack at the age of 31, treated with angioplasty. 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 His cholesterol and blood pressure are well-controlled.  He mentions a past surgical history of a 'flip flop stomach,' corrected surgically about 30 years ago. This condition involved stomach rotation causing significant discomfort until surgically corrected.  Current medications include Zetia  and rosuvastatin  for cholesterol, magnesium citrate, and stool softeners for constipation management. He also takes probiotics, although they do not directly address his constipation.  No fever or other  systemic symptoms.          07/26/2024    9:39 AM 07/24/2022    3:33 PM 07/23/2021   10:40 AM  Depression screen PHQ 2/9  Decreased Interest 0 0 0  Down, Depressed, Hopeless 0 0 0  PHQ - 2 Score 0 0 0    History Omar [name] has a past medical history of Anxiety (07/19/2003), Aortic valve sclerosis (12/2012), CAD S/P percutaneous coronary angioplasty (05/30/1996), Chronic low back pain, Dyslipidemia, goal LDL below 70, Hypertension, Lumbosacral disc disease (01/01/2010), Obstructive sleep apnea (07/19/2003), and ST elevation myocardial infarction (STEMI) involving right coronary artery in recovery phase (HCC) (05/30/1996).   He has a past surgical history that includes Hernia repair (2005); NM MYOVIEW  LTD (05/2008); transthoracic echocardiogram (12/2012); Colonoscopy (N/A, 10/27/2018); polypectomy (10/27/2018); LEFT HEART CATH AND CORONARY ANGIOGRAPHY (12/23/1996); CT CTA CORONARY W/CA SCORE W/CM &/OR WO/CM (03/01/2024); Zio patch monitor (01/2024); transthoracic echocardiogram (03/01/2024); and Colonoscopy (N/A, 04/28/2024).   His family history includes Healthy in his mother; Other in his father.He reports that he quit smoking about 28 years ago. His smoking use included cigarettes. He has quit using smokeless tobacco. He reports current alcohol use. He reports that he does not currently use drugs after having used the following drugs: Marijuana.    ROS Review of Systems  Constitutional:  Negative for chills, diaphoresis, fever and unexpected weight change.  HENT:  Negative for rhinorrhea and trouble swallowing.   Respiratory:  Negative for cough, chest tightness and shortness of breath.   Cardiovascular:  Negative for chest pain.  Gastrointestinal:  Positive for abdominal pain. Negative for abdominal distention, blood in stool, constipation, diarrhea, nausea, rectal pain and vomiting.  Genitourinary:  Negative for dysuria,  flank pain and hematuria.  Musculoskeletal:  Negative for  arthralgias and joint swelling.  Skin:  Negative for rash.  Neurological:  Negative for syncope and headaches.    Objective:  BP 114/65   Pulse 60   Temp (!) 97.1 F (36.2 C)   Ht 5' 8 (1.727 m)   Wt 179 lb 3.2 oz (81.3 kg)   SpO2 97%   BMI 27.25 kg/m   BP Readings from Last 3 Encounters:  07/26/24 114/65  04/28/24 99/66  03/08/24 116/74    Wt Readings from Last 3 Encounters:  07/26/24 179 lb 3.2 oz (81.3 kg)  04/28/24 174 lb (78.9 kg)  03/08/24 179 lb 9.6 oz (81.5 kg)     Physical Exam Constitutional:      General: He is not in acute distress.    Appearance: He is well-developed.  HENT:     Head: Normocephalic and atraumatic.     Right Ear: External ear normal.     Left Ear: External ear normal.     Nose: Nose normal.  Eyes:     Conjunctiva/sclera: Conjunctivae normal.     Pupils: Pupils are equal, round, and reactive to light.  Cardiovascular:     Rate and Rhythm: Normal rate and regular rhythm.     Heart sounds: Normal heart sounds. No murmur heard. Pulmonary:     Effort: Pulmonary effort is normal. No respiratory distress.     Breath sounds: Normal breath sounds. No wheezing or rales.  Abdominal:     Palpations: Abdomen is soft.     Tenderness: There is abdominal tenderness in the left upper quadrant. There is guarding. There is no rebound.     Hernia: A hernia is present. Hernia is present in the ventral area.  Musculoskeletal:        General: Normal range of motion.     Cervical back: Normal range of motion and neck supple.  Skin:    General: Skin is warm and dry.  Neurological:     Mental Status: He is alert and oriented to person, place, and time.     Deep Tendon Reflexes: Reflexes are normal and symmetric.  Psychiatric:        Behavior: Behavior normal.        Thought Content: Thought content normal.        Judgment: Judgment normal.      Assessment & Plan:  Acute LUQ pain -     DG Abd 2 Views; Future  Diverticulitis -      Ciprofloxacin  HCl; Take 1 tablet (500 mg total) by mouth 2 (two) times daily.  Dispense: 14 tablet; Refill: 0 -     metroNIDAZOLE ; Take 1 tablet (500 mg total) by mouth 2 (two) times daily.  Dispense: 14 tablet; Refill: 0  Obstipation  Other orders -     [name] ; Take 1 tablet (3 mg total) by mouth daily.  Dispense: 30 tablet; Refill: 2    Assessment and Plan Assessment & Plan Constipation and Diverticulosis of Colon   He experiences chronic constipation with severe bloating and abdominal pain, especially in the left upper quadrant. Diverticulosis was confirmed via colonoscopy, and a recent x-ray shows significant fecal impaction. Current symptoms suggest a possible diverticulitis flare-up. Prescribe Linzess  and verify insurance coverage; if not covered, explore alternatives in the same class. Instruct to take magnesium citrate, one full bottle daily until bowel movements are regular, then discontinue to avoid dependency. Continue daily stool softeners, specifically Colace, two per day. Prescribe  antibiotics for potential diverticulitis, one of each in the morning and evening for one week. Advise dietary modifications to avoid nuts, seeds, and foods that may exacerbate diverticulosis.  Coronary Artery Disease   He has coronary artery disease with a previous myocardial infarction at age 70. A recent cardiology evaluation shows a 100% blocked minor branch artery, while the main artery treated with angioplasty remains patent. Continue Zetia  and rosuvastatin  as prescribed by the cardiologist. Monitor cholesterol levels regularly and ensure follow-up with the cardiologist for ongoing management.  General Health Maintenance   Annual physical exams are emphasized due to his age and medical history. Schedule an annual physical exam.  Recording duration: 18 minutes   Unfortunately the Linzess  turned out to be too expensive.  Will give a prescription for Viberzi and see if that is a little more  manageable for him.  Viberzi also turned out to be not covered by his insurance.  As result I checked on [name]  3 mg a day and it appears that it is covered as tier 3.  I will ask the patient to see if he can afford that.   Follow-up: Return in about 6 months (around 01/26/2025), or if symptoms worsen or fail to improve.  Omar [name], M.D.

## 2024-07-26 NOTE — Addendum Note (Signed)
 Addended by: ZOLLIE LOWERS on: 07/26/2024 05:22 PM   Modules accepted: Orders

## 2024-07-26 NOTE — Telephone Encounter (Signed)
 I looked at [name]  and it is a tier 3 medicine.  I sent that to his pharmacy for him.  Hopefully that will be more affordable.

## 2024-07-26 NOTE — Telephone Encounter (Unsigned)
 Copied from CRM [id number]. Topic: Clinical - Medication Question >> Jul 26, 2024 12:20 PM Gustabo D wrote: linaclotide  (LINZESS ) 290 MCG CAPS capsule- Patient says the price is to high and wants to know if he can take something else.

## 2024-07-27 NOTE — Telephone Encounter (Signed)
 I called and spoke with patient and made him aware. Says he received a from the pharmacy about it and was told that the Rx would be $389 and he can't afford that. Any other options?

## 2024-07-28 NOTE — Telephone Encounter (Signed)
 Left detailed message per signed DPR. Encouraged call back or send us  a Mychart message if there are any questions.

## 2024-07-28 NOTE — Telephone Encounter (Signed)
 Have him use Miralax BID to QID and I will set him up with [facility] pharmacy to see if they can get him on a grant for one of the meds

## 2024-09-15 ENCOUNTER — Other Ambulatory Visit: Payer: Self-pay | Admitting: Cardiology

## 2024-09-22 ENCOUNTER — Other Ambulatory Visit: Payer: Self-pay | Admitting: Cardiology

## 2024-09-28 ENCOUNTER — Other Ambulatory Visit: Payer: Self-pay | Admitting: Cardiology

## 2024-12-23 ENCOUNTER — Other Ambulatory Visit

## 2024-12-23 ENCOUNTER — Ambulatory Visit: Admitting: Surgical

## 2024-12-23 DIAGNOSIS — M5442 Lumbago with sciatica, left side: Secondary | ICD-10-CM | POA: Diagnosis not present

## 2024-12-25 ENCOUNTER — Encounter: Payer: Self-pay | Admitting: Surgical

## 2024-12-25 NOTE — Progress Notes (Signed)
 "  Office Visit Note   Patient: Omar [name]           Date of Birth: [birth year]           MRN: [id number] Visit Date: 12/23/2024 Requested by: Zollie Lowers, MD [address] [place],  [place] [address] PCP: Zollie Lowers, MD  Subjective: Chief Complaint  Patient presents with   Lower Back - Pain    States he has had chronic back pain but has been worsening for several months. History of ESI's that provided relief. Currently having radicular left leg symptoms. Unable to be up and walking more than 15 minutes without leg feeling numb and limp. Saw Dr Addie at the hospital during recent visit with family member and was told to be seen so we could order scan for him.     HPI: Omar [name] is a 69 y.o. male who presents to the office reporting low back pain.  Patient complains of back pain that is chronic for him but has been worsening over the last 6 months.  He has had prior lumbar spine ESI's have given him good relief but the last time he had this was about 5 years ago.  He describes neurogenic claudication symptoms with bilateral leg pain that comes on reliably with 10 to 15 minutes of walking.  He will feel like his legs, especially his left leg go numb and have pain in the same distribution of his numbness primarily in the upper thigh but also going down into the calf and sometimes to the foot.  Left leg bothers him more than the right.  No bowel or bladder incontinence or saddle anesthesia.  No new fall or injury..                ROS: All systems reviewed are negative as they relate to the chief complaint within the history of present illness.  Patient denies fevers or chills.  Assessment & Plan: Visit Diagnoses:  1. Left-sided low back pain with left-sided sciatica, unspecified chronicity     Plan: Impression is 69 year old male who presents with chronic back pain and worsening leg pain with neurogenic claudication symptoms.  Has prior lumbar spine ESI's that gave him great  relief and he would like to repeat these.  Unfortunately last MRI scan was from 2011 which at that time demonstrated central canal stenosis and foraminal narrowing at L3-L4 and L4-L5.  Need new MRI to best identify target for injections.  We will order MRI and have him follow-up with Dr. Eldonna for lumbar spine ESI afterward.  Patient agreed with plan.  He does not take any blood thinners aside from 81 mg aspirin.  Follow-Up Instructions: No follow-ups on file.   Orders:  Orders Placed This Encounter  Procedures   XR Lumbar Spine 2-3 Views   MR Lumbar Spine w/o contrast   Ambulatory referral to Physical Medicine Rehab   No orders of the defined types were placed in this encounter.     Procedures: No procedures performed   Clinical Data: No additional findings.  Objective: Vital Signs: There were no vitals taken for this visit.  Physical Exam:  Constitutional: Patient appears well-developed HEENT:  Head: Normocephalic Eyes:EOM are normal Neck: Normal range of motion Cardiovascular: Normal rate Pulmonary/chest: Effort normal Neurologic: Patient is alert Skin: Skin is warm Psychiatric: Patient has normal mood and affect  Ortho Exam: Ortho exam demonstrates intact hip flexion, quadricep, hamstring, dorsiflexion, plantarflexion, EHL strength bilaterally rated 5/5.  No  clonus noted bilaterally.  Positive straight leg raise on left, negative on right.  No tenderness over SI joint bilaterally.  Does have tenderness over axial lumbar spine primarily around L4-L5 and L5-S1 level.  Specialty Comments:  No specialty comments available.  Imaging: No results found.   PMFS History: Patient Active Problem List   Diagnosis Date Noted   Cecal polyp 04/28/2024   Palpitations 01/23/2024   Precordial pain 01/23/2024   Dyspnea on exertion 01/23/2024   Fatigue due to treatment 07/02/2020   Stable angina 10/20/2019   Nocturnal leg cramps 10/20/2019   Special screening for malignant  neoplasms, colon 07/28/2018   Elevated blood sugar level 04/02/2018   Constipation by delayed colonic transit 03/31/2017   Essential hypertension 03/31/2017   Acute left-sided low back pain with sciatica 06/26/2016   Impaired fasting glucose 04/02/2016   Obesity (BMI 30-39.9) 08/31/2014   Hyperlipidemia with target low density lipoprotein (LDL) cholesterol less than 55 mg/dL    Aortic valve sclerosis 12/01/2012   Obstructive sleep apnea 07/19/2003   History of acute inferior wall MI 05/30/1996   Coronary artery disease involving native coronary artery of native heart with angina pectoris 05/30/1996   Past Medical History:  Diagnosis Date   Anxiety 07/19/2003   Dr.Saaat [facility] [place]   Aortic valve sclerosis 12/2012   Noted on echocardiogram to have aortic sclerosis without stenosis. Ordered for murmur   CAD S/P percutaneous coronary angioplasty 05/30/1996   single -vessel disease (RCA) Rx with POBA on PAMI protocol in june 1997; Re-Look Cath 12/1996: ~20-30% residual no stenosis in 1998 with good LV Function; Myoview  6/'09:  low risk scan ,post EF 58%, this was the fourth such scan in as many years   Chronic low back pain    Has seen Dr. Addie   Dyslipidemia, goal LDL below 70    On statin   Hypertension    Lumbosacral disc disease 01/01/2010   ThIs information from CT done several years ago   Obstructive sleep apnea 07/19/2003   breathing disorder services,Dr. Saadat Khann. 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   ST elevation myocardial infarction (STEMI) involving right coronary artery in recovery phase (HCC) 05/30/1996   100% occluded RCA -- PTCA-POBA (~20% residual) of RCA (part of PAMI Trial) ;; Normal EF By Echo in 2014    Family History  Problem Relation Age of Onset   Healthy Mother    Other Father        Unknown details   Colon cancer Neg Hx     Past Surgical History:  Procedure Laterality Date    COLONOSCOPY N/A 10/27/2018   Procedure: COLONOSCOPY;  Surgeon: Golda Claudis PENNER, MD;  [facility];  Service: Endoscopy;  Laterality: N/A;  1030   COLONOSCOPY N/A 04/28/2024   Procedure: COLONOSCOPY;  Surgeon: Cinderella Deatrice FALCON, MD;  Location: [facility];  Service: Endoscopy;  Laterality: N/A;  7:45AM;ASA 2   CT CTA CORONARY W/CA SCORE W/CM &/OR WO/CM  03/01/2024   CAC 1194 with TPG 772. Dominant RCA minimal plaque t/o -< Terminates as PDA with PLB (FFRCT neg).  LMCA: Large caliber w/ mild mixed plaque -< trifurcates into LAD, LCx and RI.LAD: minimal Prox, mild mid & minimal distal LAD (FFR Ct Neg).  Minimal plaque branching D1 w/ Mod ostial plaque (FFR Ct Neg); nondominant relatively small LCx - prox CTO.  RI with mild prox  Dz.  HERNIA REPAIR  2005   remote laparscopic hernia repair by Dr.martin in may 2005   LEFT HEART CATH AND CORONARY ANGIOGRAPHY  12/23/1996   ASHD h/o acute DMI with emergency PTCA- randomized to balloon PTCA 05-30-1996 reperfusion time 2 hrs 2 min.,single vessel coronary disease RCA   NM MYOVIEW  LTD  05/2008   Mild anterior defect consistent with chest wall attenuation. Normal EF and no ischemia, no infarction   POLYPECTOMY  10/27/2018   Procedure: POLYPECTOMY;  Surgeon: Golda Claudis PENNER, MD;  Location: [facility];  Service: Endoscopy;;  colon   TRANSTHORACIC ECHOCARDIOGRAM  12/2012   Mild concentric LVH. Normal systolic and diastolic function with EF 55-65%. Mild LA dilation. Aortic sclerosis without stenosis.   TRANSTHORACIC ECHOCARDIOGRAM  03/01/2024   Normal LV size and function.  EF 55%.  No RWMA.  Mild interventricular septal thickening.  Mild AV calcification/sclerosis with no stenosis.  Normal MV   Zio patch monitor  01/2024   Normal rhythm, heart rate range 41-139 bpm, average 71 bpm, rare premature atrial contractions (PACs), rare premature ventricular contractions (PVCs), four short atrial runs (5-14 beats, max 150 bpm)   Social History    Occupational History   Not on file  Tobacco Use   Smoking status: Former    Current packs/day: 0.00    Types: Cigarettes    Quit date: 12/02/1995    Years since quitting: 29.0   Smokeless tobacco: Former  Substance and Sexual Activity   Alcohol use: Yes    Comment: occasionally   Drug use: Not Currently    Types: Marijuana    Comment: in the 1960's   Sexual activity: Yes    Partners: Female    Comment: married        "

## 2025-01-02 ENCOUNTER — Other Ambulatory Visit

## 2025-01-04 ENCOUNTER — Inpatient Hospital Stay: Admission: RE | Admit: 2025-01-04 | Discharge: 2025-01-04 | Attending: Surgical

## 2025-01-04 DIAGNOSIS — M5442 Lumbago with sciatica, left side: Secondary | ICD-10-CM

## 2025-01-05 ENCOUNTER — Ambulatory Visit: Admitting: Family Medicine

## 2025-01-05 ENCOUNTER — Encounter: Payer: Self-pay | Admitting: Family Medicine

## 2025-01-05 VITALS — BP 112/75 | HR 60 | Temp 97.9°F | Ht 68.0 in | Wt 179.4 lb

## 2025-01-05 DIAGNOSIS — Z125 Encounter for screening for malignant neoplasm of prostate: Secondary | ICD-10-CM

## 2025-01-05 DIAGNOSIS — N486 Induration penis plastica: Secondary | ICD-10-CM

## 2025-01-05 DIAGNOSIS — E785 Hyperlipidemia, unspecified: Secondary | ICD-10-CM

## 2025-01-05 DIAGNOSIS — I10 Essential (primary) hypertension: Secondary | ICD-10-CM

## 2025-01-05 DIAGNOSIS — Z23 Encounter for immunization: Secondary | ICD-10-CM

## 2025-01-05 NOTE — Progress Notes (Unsigned)
" ° °  Subjective:  Patient ID: Omar [name], male    DOB: 04/06/1956  Age: 69 y.o. MRN: [id number]  CC: Abdominal Pain   HPI  Discussed the use of AI scribe software for clinical note transcription with the patient, who gave verbal consent to proceed.  History of Present Illness           01/05/2025    1:27 PM 07/26/2024    9:39 AM 07/24/2022    3:33 PM  Depression screen PHQ 2/9  Decreased Interest 0 0 0  Down, Depressed, Hopeless 0 0 0  PHQ - 2 Score 0 0 0  Altered sleeping 0    Tired, decreased energy 0    Change in appetite 0    Feeling bad or failure about yourself  0    Trouble concentrating 0    Moving slowly or fidgety/restless 0    Suicidal thoughts 0    PHQ-9 Score 0    Difficult doing work/chores Not difficult at all      History Masaru has a past medical history of Anxiety (07/19/2003), Aortic valve sclerosis (12/2012), CAD S/P percutaneous coronary angioplasty (05/30/1996), Chronic low back pain, Dyslipidemia, goal LDL below 70, Hypertension, Lumbosacral disc disease (01/01/2010), Obstructive sleep apnea (07/19/2003), and ST elevation myocardial infarction (STEMI) involving right coronary artery in recovery phase (HCC) (05/30/1996).   He has a past surgical history that includes Hernia repair (2005); NM MYOVIEW  LTD (05/2008); transthoracic echocardiogram (12/2012); Colonoscopy (N/A, 10/27/2018); polypectomy (10/27/2018); LEFT HEART CATH AND CORONARY ANGIOGRAPHY (12/23/1996); CT CTA CORONARY W/CA SCORE W/CM &/OR WO/CM (03/01/2024); Zio patch monitor (01/2024); transthoracic echocardiogram (03/01/2024); and Colonoscopy (N/A, 04/28/2024).   His family history includes Healthy in his mother; Other in his father.He reports that he quit smoking about 29 years ago. His smoking use included cigarettes. He has quit using smokeless tobacco. He reports current alcohol use. He reports that he does not currently use drugs after having used the following drugs:  Marijuana.    ROS Review of Systems  Objective:  BP 112/75   Pulse 60   Temp 97.9 F (36.6 C)   Ht 5' 8 (1.727 m)   Wt 179 lb 6 oz (81.4 kg)   SpO2 99%   BMI 27.27 kg/m   BP Readings from Last 3 Encounters:  01/05/25 112/75  07/26/24 114/65  04/28/24 99/66    Wt Readings from Last 3 Encounters:  01/05/25 179 lb 6 oz (81.4 kg)  07/26/24 179 lb 3.2 oz (81.3 kg)  04/28/24 174 lb (78.9 kg)     Physical Exam Physical Exam    Assessment & Plan:  Need for shingles vaccine    Assessment and Plan Assessment & Plan        Follow-up: No follow-ups on file.  Butler Der, M.D. "

## 2025-01-06 LAB — CMP14+EGFR
ALT: 18 [IU]/L (ref 0–44)
AST: 24 [IU]/L (ref 0–40)
Albumin: 4.8 g/dL (ref 3.9–4.9)
Alkaline Phosphatase: 99 [IU]/L (ref 47–123)
BUN/Creatinine Ratio: 14 (ref 10–24)
BUN: 15 mg/dL (ref 8–27)
Bilirubin Total: 1.8 mg/dL — ABNORMAL HIGH (ref 0.0–1.2)
CO2: 23 mmol/L (ref 20–29)
Calcium: 9.7 mg/dL (ref 8.6–10.2)
Chloride: 101 mmol/L (ref 96–106)
Creatinine, Ser: 1.11 mg/dL (ref 0.76–1.27)
Globulin, Total: 1.8 g/dL (ref 1.5–4.5)
Glucose: 84 mg/dL (ref 70–99)
Potassium: 4.6 mmol/L (ref 3.5–5.2)
Sodium: 141 mmol/L (ref 134–144)
Total Protein: 6.6 g/dL (ref 6.0–8.5)
eGFR: 71 mL/min/{1.73_m2}

## 2025-01-06 LAB — LIPID PANEL
Chol/HDL Ratio: 2 ratio (ref 0.0–5.0)
Cholesterol, Total: 87 mg/dL — ABNORMAL LOW (ref 100–199)
HDL: 44 mg/dL
LDL Chol Calc (NIH): 26 mg/dL (ref 0–99)
Triglycerides: 84 mg/dL (ref 0–149)
VLDL Cholesterol Cal: 17 mg/dL (ref 5–40)

## 2025-01-06 LAB — CBC WITH DIFFERENTIAL/PLATELET
Basophils Absolute: 0.1 10*3/uL (ref 0.0–0.2)
Basos: 1 %
EOS (ABSOLUTE): 0.3 10*3/uL (ref 0.0–0.4)
Eos: 4 %
Hematocrit: 40.8 % (ref 37.5–51.0)
Hemoglobin: 13.8 g/dL (ref 13.0–17.7)
Immature Grans (Abs): 0 10*3/uL (ref 0.0–0.1)
Immature Granulocytes: 0 %
Lymphocytes Absolute: 1.5 10*3/uL (ref 0.7–3.1)
Lymphs: 20 %
MCH: 31.4 pg (ref 26.6–33.0)
MCHC: 33.8 g/dL (ref 31.5–35.7)
MCV: 93 fL (ref 79–97)
Monocytes Absolute: 0.7 10*3/uL (ref 0.1–0.9)
Monocytes: 9 %
Neutrophils Absolute: 4.7 10*3/uL (ref 1.4–7.0)
Neutrophils: 66 %
Platelets: 222 10*3/uL (ref 150–450)
RBC: 4.39 x10E6/uL (ref 4.14–5.80)
RDW: 12.4 % (ref 11.6–15.4)
WBC: 7.2 10*3/uL (ref 3.4–10.8)

## 2025-01-06 LAB — PSA, TOTAL AND FREE
PSA, Free Pct: 46.7 %
PSA, Free: 0.28 ng/mL
Prostate Specific Ag, Serum: 0.6 ng/mL (ref 0.0–4.0)

## 2025-01-09 ENCOUNTER — Ambulatory Visit: Admitting: Physical Medicine and Rehabilitation
